# Patient Record
Sex: Male | Born: 1965 | Race: White | Hispanic: No | Marital: Single | Smoking: Current every day smoker
Health system: Southern US, Community
[De-identification: ages and names within clinical notes are randomized; demographics above are authoritative.]

## PROBLEM LIST (undated history)

## (undated) DIAGNOSIS — G4730 Sleep apnea, unspecified: Secondary | ICD-10-CM

## (undated) DIAGNOSIS — K219 Gastro-esophageal reflux disease without esophagitis: Secondary | ICD-10-CM

## (undated) DIAGNOSIS — M1990 Unspecified osteoarthritis, unspecified site: Secondary | ICD-10-CM

## (undated) DIAGNOSIS — K5900 Constipation, unspecified: Secondary | ICD-10-CM

## (undated) HISTORY — PX: OTHER SURGICAL HISTORY: SHX169

## (undated) HISTORY — PX: BACK SURGERY: SHX140

## (undated) HISTORY — PX: HAND SURGERY: SHX662

---

## 2010-09-24 ENCOUNTER — Emergency Department (HOSPITAL_BASED_OUTPATIENT_CLINIC_OR_DEPARTMENT_OTHER)
Admission: EM | Admit: 2010-09-24 | Discharge: 2010-09-24 | Disposition: A | Discharge status: Home / Self Care | Payer: Self-pay | Attending: Emergency Medicine | Admitting: Emergency Medicine

## 2010-09-24 DIAGNOSIS — G8929 Other chronic pain: Secondary | ICD-10-CM | POA: Insufficient documentation

## 2010-09-24 DIAGNOSIS — F17200 Nicotine dependence, unspecified, uncomplicated: Secondary | ICD-10-CM | POA: Insufficient documentation

## 2010-09-24 DIAGNOSIS — IMO0002 Reserved for concepts with insufficient information to code with codable children: Secondary | ICD-10-CM | POA: Insufficient documentation

## 2010-09-24 DIAGNOSIS — M5137 Other intervertebral disc degeneration, lumbosacral region: Secondary | ICD-10-CM | POA: Insufficient documentation

## 2010-09-24 DIAGNOSIS — M51379 Other intervertebral disc degeneration, lumbosacral region without mention of lumbar back pain or lower extremity pain: Secondary | ICD-10-CM | POA: Insufficient documentation

## 2011-01-06 ENCOUNTER — Encounter (HOSPITAL_COMMUNITY)
Admission: RE | Admit: 2011-01-06 | Discharge: 2011-01-06 | Disposition: A | Discharge status: Home / Self Care | Payer: Self-pay | Source: Ambulatory Visit | Attending: Neurological Surgery | Admitting: Neurological Surgery

## 2011-01-06 LAB — CBC
Hemoglobin: 15.4 g/dL (ref 13.0–17.0)
MCH: 32.4 pg (ref 26.0–34.0)
MCHC: 36.2 g/dL — ABNORMAL HIGH (ref 30.0–36.0)
MCV: 89.5 fL (ref 78.0–100.0)

## 2011-01-06 LAB — SURGICAL PCR SCREEN: MRSA, PCR: NEGATIVE

## 2011-01-12 ENCOUNTER — Ambulatory Visit (HOSPITAL_COMMUNITY)
Admission: RE | Admit: 2011-01-12 | Discharge: 2011-01-12 | Disposition: A | Discharge status: Home / Self Care | Payer: Self-pay | Source: Ambulatory Visit | Attending: Neurological Surgery | Admitting: Neurological Surgery

## 2011-01-12 ENCOUNTER — Inpatient Hospital Stay (HOSPITAL_COMMUNITY): Payer: Self-pay

## 2011-01-12 DIAGNOSIS — M5126 Other intervertebral disc displacement, lumbar region: Principal | ICD-10-CM | POA: Insufficient documentation

## 2011-01-12 DIAGNOSIS — Z01812 Encounter for preprocedural laboratory examination: Secondary | ICD-10-CM | POA: Insufficient documentation

## 2011-01-12 DIAGNOSIS — F17200 Nicotine dependence, unspecified, uncomplicated: Secondary | ICD-10-CM | POA: Insufficient documentation

## 2011-01-12 LAB — CBC
Hemoglobin: 15.4 g/dL (ref 13.0–17.0)
MCH: 32 pg (ref 26.0–34.0)
MCHC: 35.6 g/dL (ref 30.0–36.0)
MCV: 89.6 fL (ref 78.0–100.0)

## 2011-03-04 NOTE — Op Note (Signed)
Darrell Ramirez, Darrell Ramirez                ACCOUNT NO.:  [id number]  MEDICAL RECORD NO.:  [id number]  LOCATION:  3535                         FACILITY:  [facility]  PHYSICIAN:  Stefani Dama, M.D.  DATE OF BIRTH:  05-10-1965  DATE OF PROCEDURE:  01/12/2011 DATE OF DISCHARGE:  01/12/2011                              OPERATIVE REPORT   PREOPERATIVE DIAGNOSIS:  Herniated nucleus pulposus L5-S1 left, recurrent L4-L5.  POSTOPERATIVE DIAGNOSIS:  Herniated nucleus pulposus L5-S1 left, recurrent L4-L5.  OPERATION: 1. Repeat laminotomy and foraminotomy, decompressive diskectomy L5-S1     left with decompression of L5-S1 nerve roots. 2. Laminotomy and microdiskectomy L4-L5 left with operating microscope     microdissection technique and decompression of L4-L5 nerve root.  SURGEON:  Stefani Dama, MD  FIRST ASSISTANT:  Danae Orleans. Venetia Maxon, MD  ANESTHESIA:  General endotracheal.  INDICATIONS:  Darrell Ramirez is a 45 year old individual who has evidence of increasingly severe left lumbar radiculopathy in L5 and S1 distributions.  The patient has evidence of previous diskectomy done about 10 years ago at L5-S1 on the left.  An MRI done a number of months ago demonstrated that he had a recurrent herniated nucleus pulposus at L5-S1 on the left side.  He also had evidence of a subligamentous herniation of the disk at L4-L5 on the left causing lateral recess stenosis.  He has failed all conservative management.  The last month, he has had worsening pain.  He was advised regarding need for surgical decompression to the level that and is now taken to the operating room this day.  PROCEDURE:  The patient was brought to the operating room supine on the stretcher.  After smooth induction of general endotracheal anesthesia, he was turned prone.  Back was prepped with alcohol and DuraPrep and draped in a sterile fashion.  The previously made area of the scar was excised in an elliptical fashion.  Dissection  was taken down to the lumbodorsal fascia which was opened on the left side.  Interlaminar space at L5-S1 and L4-5 were identified positively with radiograph and laminotomies were then carried out.  At L5-S1, there was a significant amount of scar that has healed.  The resulting laminar defect was enlarged using a high-speed drill with a 0.5 mm dissector tool.  The yellow ligament was then carefully identified on to the inferior margin of lamina of L5 and then this being taken up the common dural tube could be traced out to the lateral aspects.  There was some significant adherent scar tissue on the lateral aspect of the common dural tube and the medial wall of the pedicle was carefully dissected free.  Nerve roots were then immobilized.  Underneath it, there was noted to be a substantial mass of recurrent disk material.  This was removed in a very piecemeal fashion as it was attached with the dura and the dissection was undertaken slowly.  Nonetheless, in the end, I was able to mobilize the common dural tube in the S1 nerve root fully off the disk space and entering the disk space which was significantly collapsed. The disk space was evacuated potentially with the amount of severely degenerated and desiccated disk material.  Portion of the procedure was done with Dr. __________ with lateral retraction on the common dural tube and the nerve roots were laterally decompressed interspace while I worked towards the medial aspect.  We then examined L4-L5 and here we enlarged laminotomy.  We removed the yellow ligament, identified the common dural tube and noted that the takeoff of the L5 nerve root was mobilized medially over a significant mass.  This was found to be a subligamentous herniated portion of this and when the ligament was probed, fragment of disk was elicited through an opening.  The opening was then enlarged in the same method where there was a significant amount of  moderately degenerated disk material within the disk space.  Disk space was evacuated above and this field was the same from its aperture.  The aperture was slightly enlarged to allow for complete diskectomy.  In the end, the common dural tube takeoff the L4 nerve root superiorly, L5 nerve root inferiorly, S1 nerve root below that were well processed. Hemostasis was achieved in the soft tissues and then after careful inspection and irrigation with copious amounts of antibiotic irrigation, the lumbodorsal fascia was closed with #1 Vicryl in interrupted fashion, 2-0 Vicryl in the subcutaneous tissues, 3-0 Vicryl subcuticularly, Dermabond was used on the skin prior to closure of the subcutaneous tissues.  The fascia was infiltrated with 20 mL of 0.5% Marcaine.  Blood loss for the procedure was estimated __________.     Stefani Dama, M.D.     Merla Riches  D:  01/12/2011  T:  01/13/2011  Job:  [id number]  Electronically Signed by Barnett Abu M.D. on 03/04/2011 06:52:15 AM

## 2011-04-21 ENCOUNTER — Other Ambulatory Visit: Payer: Self-pay | Admitting: Neurological Surgery

## 2011-04-21 DIAGNOSIS — M5126 Other intervertebral disc displacement, lumbar region: Secondary | ICD-10-CM

## 2011-04-21 DIAGNOSIS — M5136 Other intervertebral disc degeneration, lumbar region: Secondary | ICD-10-CM

## 2011-09-03 ENCOUNTER — Other Ambulatory Visit: Payer: Self-pay | Admitting: Neurological Surgery

## 2011-09-23 ENCOUNTER — Encounter (HOSPITAL_COMMUNITY): Payer: Self-pay

## 2011-09-23 ENCOUNTER — Inpatient Hospital Stay (HOSPITAL_COMMUNITY): Admission: RE | Admit: 2011-09-23 | Discharge: 2011-09-23 | Payer: Self-pay | Source: Ambulatory Visit

## 2011-09-23 HISTORY — DX: Gastro-esophageal reflux disease without esophagitis: K21.9

## 2011-09-23 HISTORY — DX: Sleep apnea, unspecified: G47.30

## 2011-09-23 NOTE — Pre-Procedure Instructions (Signed)
20 Darrell Ramirez  09/23/2011   Your procedure is scheduled on: 7:30 AM Report to [facility] at 5:30AM.  Call this number if you have problems the morning of surgery: [phone]   Remember:   Do not eat food:After Midnight.  May have clear liquids: up to 4 Hours before arrival. Until 1:30 AM  Clear liquids include soda, tea, black coffee, apple or grape juice, broth.  Take these medicines the morning of surgery with A SIP OF WATER  Do not wear jewelry, make-up or nail polish.  Do not wear lotions, powders, or perfumes. You may wear deodorant.  Do not shave 48 hours prior to surgery. Men may shave face and neck.  Do not bring valuables to the hospital.  Contacts, dentures or bridgework may not be worn into surgery.  Leave suitcase in the car. After surgery it may be brought to your room.  For patients admitted to the hospital, checkout time is 11:00 AM the day of discharge.   Patients discharged the day of surgery will not be allowed to drive home.  Name and phone number of your driver:  Special Instructions: CHG Shower Use Special Wash: 1/2 bottle night before surgery and 1/2 bottle morning of surgery.   Please read over the following fact sheets that you were given: Pain Booklet, Coughing and Deep Breathing, MRSA Information and Surgical Site Infection Prevention

## 2011-09-29 ENCOUNTER — Encounter (HOSPITAL_COMMUNITY): Payer: Self-pay | Admitting: Pharmacy Technician

## 2011-09-30 ENCOUNTER — Inpatient Hospital Stay (HOSPITAL_COMMUNITY): Admission: RE | Admit: 2011-09-30 | Payer: Self-pay | Source: Ambulatory Visit

## 2011-10-05 ENCOUNTER — Encounter (HOSPITAL_COMMUNITY)
Admission: RE | Admit: 2011-10-05 | Discharge: 2011-10-05 | Disposition: A | Discharge status: Home / Self Care | Payer: Medicaid Other | Source: Ambulatory Visit | Attending: Neurological Surgery | Admitting: Neurological Surgery

## 2011-10-05 LAB — CBC
MCH: 30.6 pg (ref 26.0–34.0)
MCHC: 34.6 g/dL (ref 30.0–36.0)
MCV: 88.4 fL (ref 78.0–100.0)
Platelets: 418 10*3/uL — ABNORMAL HIGH (ref 150–400)
RBC: 4.93 MIL/uL (ref 4.22–5.81)
RDW: 12.9 % (ref 11.5–15.5)

## 2011-10-05 LAB — BASIC METABOLIC PANEL
BUN: 11 mg/dL (ref 6–23)
CO2: 27 mEq/L (ref 19–32)
Calcium: 10.1 mg/dL (ref 8.4–10.5)
Creatinine, Ser: 0.89 mg/dL (ref 0.50–1.35)
GFR calc non Af Amer: >90 mL/min (ref >90)
Glucose, Bld: 108 mg/dL — ABNORMAL HIGH (ref 70–99)
Sodium: 139 mEq/L (ref 135–145)

## 2011-10-05 LAB — TYPE AND SCREEN: ABO/RH(D): A POS

## 2011-10-11 MED ORDER — CEFAZOLIN SODIUM-DEXTROSE 2-3 GM-% IV SOLR: 2.0000 g | INTRAVENOUS | Status: DC

## 2011-10-12 ENCOUNTER — Inpatient Hospital Stay (HOSPITAL_COMMUNITY)
Admission: RE | Admit: 2011-10-12 | Discharge: 2011-10-15 | DRG: 460 | Disposition: A | Discharge status: Home / Self Care | Payer: Medicaid Other | Source: Ambulatory Visit | Attending: Neurological Surgery | Admitting: Neurological Surgery

## 2011-10-12 ENCOUNTER — Ambulatory Visit (HOSPITAL_COMMUNITY): Payer: Medicaid Other

## 2011-10-12 ENCOUNTER — Encounter (HOSPITAL_COMMUNITY)
Admission: RE | Disposition: A | Discharge status: Home / Self Care | Payer: Self-pay | Source: Ambulatory Visit | Attending: Neurological Surgery

## 2011-10-12 ENCOUNTER — Encounter (HOSPITAL_COMMUNITY): Payer: Self-pay | Admitting: Anesthesiology

## 2011-10-12 ENCOUNTER — Encounter (HOSPITAL_COMMUNITY): Payer: Self-pay | Admitting: *Deleted

## 2011-10-12 ENCOUNTER — Ambulatory Visit (HOSPITAL_COMMUNITY): Payer: Medicaid Other | Admitting: Anesthesiology

## 2011-10-12 DIAGNOSIS — F17200 Nicotine dependence, unspecified, uncomplicated: Secondary | ICD-10-CM | POA: Diagnosis present

## 2011-10-12 DIAGNOSIS — Z885 Allergy status to narcotic agent status: Secondary | ICD-10-CM

## 2011-10-12 DIAGNOSIS — M48061 Spinal stenosis, lumbar region without neurogenic claudication: Secondary | ICD-10-CM

## 2011-10-12 DIAGNOSIS — M47817 Spondylosis without myelopathy or radiculopathy, lumbosacral region: Principal | ICD-10-CM | POA: Diagnosis present

## 2011-10-12 DIAGNOSIS — K219 Gastro-esophageal reflux disease without esophagitis: Secondary | ICD-10-CM | POA: Diagnosis present

## 2011-10-12 DIAGNOSIS — G8929 Other chronic pain: Secondary | ICD-10-CM | POA: Diagnosis present

## 2011-10-12 DIAGNOSIS — M5106 Intervertebral disc disorders with myelopathy, lumbar region: Secondary | ICD-10-CM | POA: Diagnosis present

## 2011-10-12 DIAGNOSIS — G4730 Sleep apnea, unspecified: Secondary | ICD-10-CM | POA: Diagnosis present

## 2011-10-12 SURGERY — POSTERIOR LUMBAR FUSION 2 LEVEL
Anesthesia: General | Site: Spine Lumbar | Wound class: Clean

## 2011-10-12 MED ORDER — BUPIVACAINE HCL (PF) 0.5 % IJ SOLN
INTRAMUSCULAR | Status: DC | PRN
  Administered 2011-10-12: 25 mL
  Administered 2011-10-12: 5 mL

## 2011-10-12 MED ORDER — ONDANSETRON HCL 4 MG/2ML IJ SOLN: 4.0000 mg | Freq: Four times a day (QID) | INTRAMUSCULAR | Status: DC | PRN

## 2011-10-12 MED ORDER — DIAZEPAM 5 MG PO TABS
ORAL_TABLET | ORAL | Status: AC
  Filled 2011-10-12: qty 1

## 2011-10-12 MED ORDER — SODIUM CHLORIDE 0.9 % IJ SOLN: 3.0000 mL | INTRAMUSCULAR | Status: DC | PRN

## 2011-10-12 MED ORDER — ONDANSETRON HCL 4 MG/2ML IJ SOLN
INTRAMUSCULAR | Status: DC | PRN
  Administered 2011-10-12: 4 mg via INTRAVENOUS

## 2011-10-12 MED ORDER — GLYCOPYRROLATE 0.2 MG/ML IJ SOLN
INTRAMUSCULAR | Status: DC | PRN
  Administered 2011-10-12: 0.2 mg via INTRAVENOUS

## 2011-10-12 MED ORDER — DROPERIDOL 2.5 MG/ML IJ SOLN: 0.6250 mg | INTRAMUSCULAR | Status: DC | PRN

## 2011-10-12 MED ORDER — DIAZEPAM 5 MG PO TABS
5.0000 mg | ORAL_TABLET | Freq: Four times a day (QID) | ORAL | Status: DC | PRN
  Administered 2011-10-12 – 2011-10-13 (×4): 5 mg via ORAL
  Filled 2011-10-12 (×4): qty 1

## 2011-10-12 MED ORDER — CEFAZOLIN SODIUM-DEXTROSE 2-3 GM-% IV SOLR
INTRAVENOUS | Status: AC
  Administered 2011-10-12: 1 g via INTRAVENOUS
  Administered 2011-10-12: 2 g via INTRAVENOUS
  Filled 2011-10-12: qty 50

## 2011-10-12 MED ORDER — SODIUM CHLORIDE 0.9 % IV SOLN
INTRAVENOUS | Status: AC
  Filled 2011-10-12: qty 500

## 2011-10-12 MED ORDER — VECURONIUM BROMIDE 10 MG IV SOLR
INTRAVENOUS | Status: DC | PRN
  Administered 2011-10-12 (×3): 2 mg via INTRAVENOUS
  Administered 2011-10-12 (×2): 1 mg via INTRAVENOUS
  Administered 2011-10-12: 2 mg via INTRAVENOUS
  Administered 2011-10-12 (×2): 1 mg via INTRAVENOUS
  Administered 2011-10-12: 2 mg via INTRAVENOUS

## 2011-10-12 MED ORDER — 0.9 % SODIUM CHLORIDE (POUR BTL) OPTIME
TOPICAL | Status: DC | PRN
  Administered 2011-10-12: 1000 mL

## 2011-10-12 MED ORDER — LIDOCAINE-EPINEPHRINE 1 %-1:100000 IJ SOLN
INTRAMUSCULAR | Status: DC | PRN
  Administered 2011-10-12: 5 mL

## 2011-10-12 MED ORDER — LACTATED RINGERS IV SOLN
INTRAVENOUS | Status: DC | PRN
  Administered 2011-10-12 (×2): via INTRAVENOUS

## 2011-10-12 MED ORDER — ACETAMINOPHEN 325 MG PO TABS: 650.0000 mg | ORAL_TABLET | ORAL | Status: DC | PRN

## 2011-10-12 MED ORDER — ACETAMINOPHEN 650 MG RE SUPP: 650.0000 mg | RECTAL | Status: DC | PRN

## 2011-10-12 MED ORDER — HYDROMORPHONE HCL PF 1 MG/ML IJ SOLN
0.2500 mg | INTRAMUSCULAR | Status: DC | PRN
  Administered 2011-10-12 (×4): 0.5 mg via INTRAVENOUS

## 2011-10-12 MED ORDER — HYDROMORPHONE HCL PF 1 MG/ML IJ SOLN
INTRAMUSCULAR | Status: AC
  Filled 2011-10-12: qty 1

## 2011-10-12 MED ORDER — CEFAZOLIN SODIUM 1-5 GM-% IV SOLN
1.0000 g | Freq: Three times a day (TID) | INTRAVENOUS | Status: AC
  Administered 2011-10-12 – 2011-10-13 (×2): 1 g via INTRAVENOUS
  Filled 2011-10-12 (×2): qty 50

## 2011-10-12 MED ORDER — SODIUM CHLORIDE 0.9 % IV SOLN
INTRAVENOUS | Status: DC
  Administered 2011-10-12 – 2011-10-13 (×2): via INTRAVENOUS

## 2011-10-12 MED ORDER — SODIUM CHLORIDE 0.9 % IV SOLN
INTRAVENOUS | Status: DC | PRN
  Administered 2011-10-12: 12:00:00 via INTRAVENOUS

## 2011-10-12 MED ORDER — KETOROLAC TROMETHAMINE 30 MG/ML IJ SOLN
INTRAMUSCULAR | Status: AC
  Administered 2011-10-12: 15 mg
  Filled 2011-10-12: qty 1

## 2011-10-12 MED ORDER — DIPHENHYDRAMINE HCL 50 MG/ML IJ SOLN: 12.5000 mg | Freq: Four times a day (QID) | INTRAMUSCULAR | Status: DC | PRN

## 2011-10-12 MED ORDER — MENTHOL 3 MG MT LOZG: 1.0000 | LOZENGE | OROMUCOSAL | Status: DC | PRN

## 2011-10-12 MED ORDER — ONDANSETRON HCL 4 MG/2ML IJ SOLN
4.0000 mg | INTRAMUSCULAR | Status: DC | PRN
  Administered 2011-10-12: 4 mg via INTRAVENOUS
  Filled 2011-10-12: qty 2

## 2011-10-12 MED ORDER — NALOXONE HCL 0.4 MG/ML IJ SOLN: 0.4000 mg | INTRAMUSCULAR | Status: DC | PRN

## 2011-10-12 MED ORDER — LIDOCAINE HCL (CARDIAC) 20 MG/ML IV SOLN
INTRAVENOUS | Status: DC | PRN
  Administered 2011-10-12: 80 mg via INTRAVENOUS

## 2011-10-12 MED ORDER — ROCURONIUM BROMIDE 100 MG/10ML IV SOLN
INTRAVENOUS | Status: DC | PRN
  Administered 2011-10-12: 10 mg via INTRAVENOUS
  Administered 2011-10-12: 20 mg via INTRAVENOUS
  Administered 2011-10-12: 70 mg via INTRAVENOUS

## 2011-10-12 MED ORDER — CEFAZOLIN SODIUM 1-5 GM-% IV SOLN
INTRAVENOUS | Status: AC
  Filled 2011-10-12: qty 50

## 2011-10-12 MED ORDER — PNEUMOCOCCAL VAC POLYVALENT 25 MCG/0.5ML IJ INJ
0.5000 mL | INJECTION | INTRAMUSCULAR | Status: AC
  Administered 2011-10-13: 0.5 mL via INTRAMUSCULAR
  Filled 2011-10-12: qty 0.5

## 2011-10-12 MED ORDER — THROMBIN 20000 UNITS EX KIT
PACK | CUTANEOUS | Status: DC | PRN
  Administered 2011-10-12: 09:00:00 via TOPICAL

## 2011-10-12 MED ORDER — KETOROLAC TROMETHAMINE 15 MG/ML IJ SOLN
15.0000 mg | Freq: Four times a day (QID) | INTRAMUSCULAR | Status: DC | PRN
  Administered 2011-10-12 – 2011-10-14 (×8): 15 mg via INTRAVENOUS
  Filled 2011-10-12 (×5): qty 1

## 2011-10-12 MED ORDER — BACITRACIN 50000 UNITS IM SOLR
INTRAMUSCULAR | Status: AC
  Filled 2011-10-12: qty 1

## 2011-10-12 MED ORDER — FENTANYL CITRATE 0.05 MG/ML IJ SOLN
INTRAMUSCULAR | Status: DC | PRN
  Administered 2011-10-12: 100 ug via INTRAVENOUS
  Administered 2011-10-12: 50 ug via INTRAVENOUS
  Administered 2011-10-12: 100 ug via INTRAVENOUS

## 2011-10-12 MED ORDER — SENNA 8.6 MG PO TABS
1.0000 | ORAL_TABLET | Freq: Two times a day (BID) | ORAL | Status: DC
  Administered 2011-10-13 – 2011-10-14 (×4): 8.6 mg via ORAL
  Filled 2011-10-12 (×7): qty 1

## 2011-10-12 MED ORDER — SUFENTANIL CITRATE 50 MCG/ML IV SOLN
INTRAVENOUS | Status: DC | PRN
  Administered 2011-10-12: 20 ug via INTRAVENOUS
  Administered 2011-10-12 (×3): 10 ug via INTRAVENOUS
  Administered 2011-10-12: 25 ug via INTRAVENOUS
  Administered 2011-10-12: 5 ug via INTRAVENOUS
  Administered 2011-10-12 (×2): 10 ug via INTRAVENOUS

## 2011-10-12 MED ORDER — PROPOFOL 10 MG/ML IV EMUL
INTRAVENOUS | Status: DC | PRN
  Administered 2011-10-12: 200 mg via INTRAVENOUS

## 2011-10-12 MED ORDER — SODIUM CHLORIDE 0.9 % IJ SOLN: 9.0000 mL | INTRAMUSCULAR | Status: DC | PRN

## 2011-10-12 MED ORDER — PHENOL 1.4 % MT LIQD: 1.0000 | OROMUCOSAL | Status: DC | PRN

## 2011-10-12 MED ORDER — PHENYLEPHRINE HCL 10 MG/ML IJ SOLN
INTRAMUSCULAR | Status: DC | PRN
  Administered 2011-10-12: 40 ug via INTRAVENOUS
  Administered 2011-10-12 (×2): 80 ug via INTRAVENOUS
  Administered 2011-10-12: 40 ug via INTRAVENOUS

## 2011-10-12 MED ORDER — MIDAZOLAM HCL 5 MG/5ML IJ SOLN
INTRAMUSCULAR | Status: DC | PRN
  Administered 2011-10-12: 2 mg via INTRAVENOUS

## 2011-10-12 MED ORDER — DIPHENHYDRAMINE HCL 12.5 MG/5ML PO ELIX: 12.5000 mg | ORAL_SOLUTION | Freq: Four times a day (QID) | ORAL | Status: DC | PRN

## 2011-10-12 MED ORDER — SODIUM CHLORIDE 0.9 % IV SOLN: 250.0000 mL | INTRAVENOUS | Status: DC

## 2011-10-12 MED ORDER — SODIUM CHLORIDE 0.9 % IR SOLN
Status: DC | PRN
  Administered 2011-10-12: 09:00:00

## 2011-10-12 MED ORDER — HYDROMORPHONE 0.3 MG/ML IV SOLN
INTRAVENOUS | Status: DC
  Administered 2011-10-12: 4.5 mg via INTRAVENOUS
  Administered 2011-10-12: 4.8 mg via INTRAVENOUS
  Administered 2011-10-12 (×2): via INTRAVENOUS
  Administered 2011-10-13: 5.38 mg via INTRAVENOUS
  Administered 2011-10-13 (×2): 3.3 mg via INTRAVENOUS
  Administered 2011-10-13: 06:00:00 via INTRAVENOUS
  Filled 2011-10-12 (×3): qty 25

## 2011-10-12 MED ORDER — SODIUM CHLORIDE 0.9 % IJ SOLN
3.0000 mL | Freq: Two times a day (BID) | INTRAMUSCULAR | Status: DC
  Administered 2011-10-12 – 2011-10-14 (×2): 3 mL via INTRAVENOUS

## 2011-10-12 MED ORDER — HYDROMORPHONE HCL PF 1 MG/ML IJ SOLN
INTRAMUSCULAR | Status: AC
  Administered 2011-10-12: 0.5 mg
  Filled 2011-10-12: qty 1

## 2011-10-12 SURGICAL SUPPLY — 77 items
ADH SKN CLS APL DERMABOND .7 (GAUZE/BANDAGES/DRESSINGS)
ADH SKN CLS LQ APL DERMABOND (GAUZE/BANDAGES/DRESSINGS) ×2
BAG DECANTER FOR FLEXI CONT (MISCELLANEOUS) ×2 IMPLANT
BLADE SURG 15 STRL LF DISP TIS (BLADE) IMPLANT
BLADE SURG 15 STRL SS (BLADE) ×2
BLADE SURG ROTATE 9660 (MISCELLANEOUS) IMPLANT
BUR MATCHSTICK NEURO 3.0 LAGG (BURR) ×2 IMPLANT
CAGE 11MM (Cage) ×2 IMPLANT
CANISTER SUCTION 2500CC (MISCELLANEOUS) ×2 IMPLANT
CLOTH BEACON ORANGE TIMEOUT ST (SAFETY) ×2 IMPLANT
CONT SPEC 4OZ CLIKSEAL STRL BL (MISCELLANEOUS) ×4 IMPLANT
COVER BACK TABLE 24X17X13 BIG (DRAPES) IMPLANT
COVER TABLE BACK 60X90 (DRAPES) ×2 IMPLANT
DECANTER SPIKE VIAL GLASS SM (MISCELLANEOUS) ×2 IMPLANT
DERMABOND ADHESIVE PROPEN (GAUZE/BANDAGES/DRESSINGS) ×2
DERMABOND ADVANCED (GAUZE/BANDAGES/DRESSINGS)
DERMABOND ADVANCED .7 DNX12 (GAUZE/BANDAGES/DRESSINGS) ×1 IMPLANT
DERMABOND ADVANCED .7 DNX6 (GAUZE/BANDAGES/DRESSINGS) IMPLANT
DRAPE C-ARM 42X72 X-RAY (DRAPES) ×4 IMPLANT
DRAPE LAPAROTOMY 100X72X124 (DRAPES) ×2 IMPLANT
DRAPE POUCH INSTRU U-SHP 10X18 (DRAPES) ×2 IMPLANT
DRAPE PROXIMA HALF (DRAPES) IMPLANT
DURAPREP 26ML APPLICATOR (WOUND CARE) ×2 IMPLANT
ELECT REM PT RETURN 9FT ADLT (ELECTROSURGICAL) ×2
ELECTRODE REM PT RTRN 9FT ADLT (ELECTROSURGICAL) ×1 IMPLANT
GAUZE SPONGE 4X4 16PLY XRAY LF (GAUZE/BANDAGES/DRESSINGS) ×1 IMPLANT
GLOVE BIO SURGEON STRL SZ8 (GLOVE) ×1 IMPLANT
GLOVE BIOGEL PI IND STRL 7.0 (GLOVE) IMPLANT
GLOVE BIOGEL PI IND STRL 8.5 (GLOVE) ×2 IMPLANT
GLOVE BIOGEL PI INDICATOR 7.0 (GLOVE) ×6
GLOVE BIOGEL PI INDICATOR 8.5 (GLOVE) ×2
GLOVE ECLIPSE 8.5 STRL (GLOVE) ×5 IMPLANT
GLOVE EXAM NITRILE LRG STRL (GLOVE) IMPLANT
GLOVE EXAM NITRILE MD LF STRL (GLOVE) IMPLANT
GLOVE EXAM NITRILE XL STR (GLOVE) IMPLANT
GLOVE EXAM NITRILE XS STR PU (GLOVE) IMPLANT
GLOVE SS BIOGEL STRL SZ 6.5 (GLOVE) IMPLANT
GLOVE SUPERSENSE BIOGEL SZ 6.5 (GLOVE) ×2
GLOVE SURG SS PI 6.5 STRL IVOR (GLOVE) ×4 IMPLANT
GOWN BRE IMP SLV AUR LG STRL (GOWN DISPOSABLE) ×5 IMPLANT
GOWN BRE IMP SLV AUR XL STRL (GOWN DISPOSABLE) ×1 IMPLANT
GOWN STRL REIN 2XL LVL4 (GOWN DISPOSABLE) ×4 IMPLANT
KIT BASIN OR (CUSTOM PROCEDURE TRAY) ×2 IMPLANT
KIT ROOM TURNOVER OR (KITS) ×2 IMPLANT
MILL MEDIUM DISP (BLADE) ×1 IMPLANT
NDL SPNL 18GX3.5 QUINCKE PK (NEEDLE) IMPLANT
NEEDLE HYPO 22GX1.5 SAFETY (NEEDLE) ×2 IMPLANT
NEEDLE SPNL 18GX3.5 QUINCKE PK (NEEDLE) ×2 IMPLANT
NS IRRIG 1000ML POUR BTL (IV SOLUTION) ×2 IMPLANT
PACK FOAM VITOSS 10CC (Orthopedic Implant) ×1 IMPLANT
PACK LAMINECTOMY NEURO (CUSTOM PROCEDURE TRAY) ×2 IMPLANT
PAD ARMBOARD 7.5X6 YLW CONV (MISCELLANEOUS) ×8 IMPLANT
PATTIES SURGICAL .5 X1 (DISPOSABLE) ×1 IMPLANT
PATTIES SURGICAL 1X1 (DISPOSABLE) ×1 IMPLANT
PEEK PLIF NOVEL 9X25X12 (Peek) ×2 IMPLANT
ROD TI 5.5MM 5CM (Rod) ×2 IMPLANT
SCREW 35MM PEDICLE (Screw) ×2 IMPLANT
SCREW PEDICLE 45MM (Screw) ×1 IMPLANT
SCREW POLYAX 6.5X45MM (Screw) ×2 IMPLANT
SCREW SET SPINAL STD HEXALOBE (Screw) ×6 IMPLANT
SCREW SPINAL 6.5MMX50MM (Screw) ×1 IMPLANT
SPONGE GAUZE 4X4 12PLY (GAUZE/BANDAGES/DRESSINGS) ×2 IMPLANT
SPONGE LAP 4X18 X RAY DECT (DISPOSABLE) IMPLANT
SPONGE SURGIFOAM ABS GEL 100 (HEMOSTASIS) ×2 IMPLANT
SUT VIC AB 1 CT1 18XBRD ANBCTR (SUTURE) ×1 IMPLANT
SUT VIC AB 1 CT1 8-18 (SUTURE) ×4
SUT VIC AB 2-0 CP2 18 (SUTURE) ×3 IMPLANT
SUT VIC AB 3-0 SH 8-18 (SUTURE) ×3 IMPLANT
SYR 20ML ECCENTRIC (SYRINGE) ×2 IMPLANT
SYR 3ML LL SCALE MARK (SYRINGE) ×5 IMPLANT
SYR 5ML LL (SYRINGE) IMPLANT
TAPE CLOTH SURG 4X10 WHT LF (GAUZE/BANDAGES/DRESSINGS) ×1 IMPLANT
TOWEL OR 17X24 6PK STRL BLUE (TOWEL DISPOSABLE) ×2 IMPLANT
TOWEL OR 17X26 10 PK STRL BLUE (TOWEL DISPOSABLE) ×2 IMPLANT
TRAP SPECIMEN MUCOUS 40CC (MISCELLANEOUS) ×2 IMPLANT
TRAY FOLEY CATH 14FRSI W/METER (CATHETERS) ×2 IMPLANT
WATER STERILE IRR 1000ML POUR (IV SOLUTION) ×2 IMPLANT

## 2011-10-12 NOTE — H&P (Signed)
Darrell Ramirez is an 46 y.o. male.   Chief Complaint: Back and left leg pain HPI: Darrell Ramirez continues to have back and left leg pain. We visited  at the end of December and I subsequently had him visit with Dr. Ollen Bowl regarding pain management.  He has been having continued problems with pain.  He had been on OxyContin 10 mg. and was switched to Oxycodone 10 mg. t.i.d. as needed for pain.  He has been out of that medication.  He has been using some Indomethacin 50 mg. q.d. but he stopped this also.  He has been having a fair amount of chronic pain with both back pain and radicular pain.  Darrell Ramirez feels that something ultimately needs to be done for his back as he cannot tolerate the back pain.  I am reluctant to maintain him on narcotic analgesics, however.  I previously discussed with him consideration of a 2-level decompression arthrodesis.  This would involve removing the entirety of the disc at L4-5 and L5-S1.  He has had surgery there and his last MRI shows that he has some epidural fibrosis particularly on the left side at L4-5 and again on the left at L5-S1 with some lateral recess stenosis particularly at L5-S1.  The result of a surgical fusion of L4 to the sacrum remains quite variable.  I discussed this with Darrell Ramirez today.  He knows that there are some risks of the surgery not limited to potential further nerve injury, possibility of scar tissue formation, possibility of a spinal fluid leak.  All of these things not withstanding, Darrell Ramirez would like to proceed with the surgical intervention.    I indicated that the typical hospital stay would be approximately 3-5 days depending on the individual patient.  Afterwards, he would have to wear an external corset for a period of about 6-8 weeks and thereafter he would require some rehabilitation for about 3 months.  It generally takes people about six months or more to recover from surgical intervention.  Darrell Ramirez is convinced that he will need to proceed with surgical  intervention.  I noted to Darrell Ramirez that I would want to caution him about using any narcotic analgesics as he is being weaned off now for nearly a month's time.  I can see him at bimonthly intervals, that is every other month, to see if he needs any changes in his care.  He tells me that he has applied for Medicaid and is hopeful that this may come through for him so he can get on with his surgical intervention.    Past Medical History  Diagnosis Date  . Sleep apnea     has not been tested  . GERD (gastroesophageal reflux disease) otc mrdication    Past Surgical History  Procedure Date  . Arthroscopic knee     times 2 right and left  . Back surgery times 2    No family history on file. Social History:  reports that he has been smoking Cigarettes.  He has a 5 pack-year smoking history. He does not have any smokeless tobacco history on file. He reports that he drinks alcohol. He reports that he does not use illicit drugs.  Allergies:  Allergies  Allergen Reactions  . Vicodin (Hydrocodone-Acetaminophen) Itching  . Tylenol Pm Extra (Diphenhydramine-Apap (Sleep))     Insomnia     Medications Prior to Admission  Medication Sig Dispense Refill  . ibuprofen (ADVIL,MOTRIN) 200 MG tablet Take 1,000 mg by mouth 2 (two) times daily as  needed. For pain      . indomethacin (INDOCIN) 50 MG capsule Take 50 mg by mouth 2 (two) times daily with a meal.      . KRILL OIL PO Take 1 capsule by mouth daily.      Marland Kitchen oxyCODONE (OXY IR/ROXICODONE) 5 MG immediate release tablet Take 10 mg by mouth 3 (three) times daily as needed. For severe pain        No results found for this or any previous visit (from the past 48 hour(s)). No results found.  Review of Systems  HENT: Negative.   Eyes: Negative.   Respiratory: Negative.   Cardiovascular: Negative.   Gastrointestinal: Positive for heartburn.  Musculoskeletal: Positive for myalgias and back pain.  Skin: Negative.   Neurological: Positive for tingling,  sensory change, focal weakness and weakness.  Endo/Heme/Allergies: Negative.   Psychiatric/Behavioral: The patient is nervous/anxious.     Blood pressure 107/72, pulse 72, temperature 98 F (36.7 C), temperature source Oral, resp. rate 18, SpO2 97.00%. Physical Exam  Constitutional: He is oriented to person, place, and time. He appears well-developed and well-nourished.  HENT:  Head: Normocephalic and atraumatic.  Eyes: Conjunctivae and EOM are normal. Pupils are equal, round, and reactive to light.  Neck: Normal range of motion. Neck supple.  Cardiovascular: Normal rate, regular rhythm and normal heart sounds.   Respiratory: Effort normal and breath sounds normal.  GI: Soft. Bowel sounds are normal.  Musculoskeletal: He exhibits tenderness.       Left leg atrophy and weakness in tibialis anterior group.Positive SLR on Left at 30 degrees.  Neurological: He is alert and oriented to person, place, and time. He displays abnormal reflex. No cranial nerve deficit. Coordination normal.       No patellar or achilles reflex.  Skin: Skin is warm and dry.  Psychiatric: He has a normal mood and affect. His behavior is normal. Judgment and thought content normal.     Assessment/Plan Decompression and fusion L4-5 and L5-S1  Darrell Ramirez J 10/12/2011, 6:04 AM

## 2011-10-12 NOTE — Transfer of Care (Signed)
Immediate Anesthesia Transfer of Care Note  Patient: Darrell Ramirez  Procedure(s) Performed: Procedure(s) (LRB): POSTERIOR LUMBAR FUSION 2 LEVEL (N/A)  Patient Location: PACU  Anesthesia Type: General  Level of Consciousness: awake, alert , oriented and sedated  Airway & Oxygen Therapy: Patient Spontanous Breathing and Patient connected to nasal cannula oxygen  Post-op Assessment: Report given to PACU RN, Post -op Vital signs reviewed and stable and Patient moving all extremities  Post vital signs: Reviewed and stable  Complications: No apparent anesthesia complications

## 2011-10-12 NOTE — Preoperative (Signed)
Beta Blockers   Reason not to administer Beta Blockers:Not Applicable 

## 2011-10-12 NOTE — Anesthesia Procedure Notes (Signed)
Procedure Name: Intubation Performed by: Fransisca Kaufmann Pre-anesthesia Checklist: Patient identified, Emergency Drugs available, Suction available, Patient being monitored and Timeout performed Patient Re-evaluated:Patient Re-evaluated prior to inductionOxygen Delivery Method: Circle system utilized and Simple face mask Preoxygenation: Pre-oxygenation with 100% oxygen Intubation Type: IV induction Ventilation: Mask ventilation without difficulty Laryngoscope Size: Mac and 3 Grade View: Grade II Tube type: Oral Tube size: 8.0 mm Number of attempts: 1 Airway Equipment and Method: Stylet and Bite block Placement Confirmation: ETT inserted through vocal cords under direct vision,  positive ETCO2,  CO2 detector and breath sounds checked- equal and bilateral Secured at: 23 cm Tube secured with: Tape Dental Injury: Teeth and Oropharynx as per pre-operative assessment

## 2011-10-12 NOTE — Op Note (Addendum)
Preoperative diagnosis: Lumbar spondylosis with radiculopathy L4-5 and L5-S1, recurrent herniated nucleus pulposus left lumbar radiculopathy stenosis of left L4-L5 and S1 nerve roots Postoperative diagnosis: Lumbar spondylosis with radiculopathy L4-5 and L5-S1, recurrent herniated nucleus pulposus with left lumbar radiculopathy. Stenosis of the left L4-L5 and S1 nerve roots. Procedure: Lumbar decompression via laminectomy of L5 and L4 with work greater than require for simple posterior interbody arthrodesis, decompression of L4-L5 and S1 nerve roots on the left side, posterior lumbar interbody arthrodesis using peek spacers autograft and allograft at L4-5 and L5-S1, pedicle screw fixation L4-L5 and the sacrum, posterior lateral arthrodesis with autograft and allograft L4 to sacrum  Surgeon: Barnett Abu M.D. Assistant: Marikay Alar M.D. Anesthesia: Gen. endotracheal Indications: Patient is a 46 year old man whose had 2 previous decompressions and discectomies at L4-5 L5-S1 on the left most recent one was in September 2012 has had persistent left lumbar radiculopathy in both an L5 and S1 distribution she has evidence of advanced spondylosis at each of these levels with evidence of a recurrent disc herniation at L4-5 he's been advised regarding the need for surgical decompression with undercutting of the facet joints so as to render the spine unstable and also now he's been advised to undergo stabilization and fixation from L4 to the sacrum  Procedure: The patient was brought to the operating room. After the smooth induction of general endotracheal anesthesia the patient was carefully turned to the prone position padding the bony prominences appropriately. The back was prepped with alcohol and DuraPrep and draped in a sterile fashion. A midline incision was created and carried down to the lumbar dorsal fascia. Fascia was opened on either side of the midline to expose the spinous processes of the lower lumbar  spine. These were identified as L5 and L4 on the radiograph. The dissection was then completed to expose L4-5 and L5-S1.  The lateral gutters were then uncovered and bone in this area was removed to decorticate the cortical surfaces from L4 to the sacrum this area was then packed off. Laminectomy was then undertaken removing the entire laminar arch of L5 removing the inferior portion of the lamina including the entirety of the facet joint to the pars region of L4. The yellow ligament was taken up and the underlying dura was identified the dissection was carried down to the lateral gutters to decompress the L4 nerve root superiorly and then the L5 nerve root was decompressed inferiorly at the level of L4-5 entirety of L5 could not get identified and decompressed until lamina of L5 was removed and this was then undertaken taking carefully as her substantial scar tissue in the left lateral recess there is evidence of recurrent disc herniation in this area also was carefully dissected away from the dura so as to maintain the integrity of the dura and during this procedure no spinal fluid leaks had occurred. There was stenosis of the left L4 left L5 and left S1 nerve roots that were individually decompressed. The right-sided nerve roots were similarly decompressed the decompression was much easier as there had not been previous surgery here and there is no evidence of significant scar tissue.  After the nerve roots were decompressed and the dura was isolated the interbody spaces were entered and a complete discectomy was performed at L4-5 bilaterally and laterally and also at L5-S1. At L4-5 the area was prepared by decorticate Ting the endplates and sizing the interspace to fit a 12 mm tall peek spacer. Peek spacers were then filled with autograft  and allograft. The allograft was the cost. The autograft was bone shavings and also the bone of the lamina and facet joints of L4-L5 and the sacrum. The L4-5 space was then  fixed and filled with the peek spacers and also the autograft bone. Attention was then turned to L5-S1 here 11 mm peek spacers could be fitted into the interspace after distracting and clean the interspace and decorticating it fully.  At this point pedicle entry sites were then chosen at L4-L5 and the sacrum at L4 medial to lateral trajectory pedicle screws were chosen and these worse 5.5 x 35 mm pedicle screws. At L5 6.5 x 45 mm pedicle screws were used and these were first sounded then tapped and then placed after checking the holes for for any evidence of cutout. No evidence of cutout was found. The S1 screws were then performed in a similar fashion 6.5 x 50 mm screw was used on the left side to explain 5 x 45 mm screw was used on the right. After this was accomplished the rods were contoured to fit between the pedicle screws and this was a 15 mm precontoured rod which had some additional contouring added to it to fit between the screw heads from L4 to the sacrum. The lateral gutters were then packed with the autograft and allograft bone combination that was remaining. Once the rod construct was completed the area was inspected carefully and the pads of the L4 the L5 and S1 nerve roots were checked to make sure that there are patent and adequately decompressed when this was verified then the lumbar dorsal fascia was closed with #1 Vicryl in interrupted fashion 2-0 Vicryl was used in the subcutaneous tissues and 3-0 Vicryl subcuticularly blood loss for the procedure was estimated at some foreign cc 100 cc of Cell Saver blood was returned to the patient. The patient was returned to the recovery room in stable condition.

## 2011-10-12 NOTE — Progress Notes (Signed)
Patient ID: Darrell Ramirez, male   DOB: 08-18-1965, 46 y.o.   MRN: [id number] Alert oriented. Legs feel better. Back has significant muscle spasm. PCA pain medicine seems to be working well.  Incision is clean and dry. Motor function in lower extremities intact.   Plan mobilize as tolerated

## 2011-10-12 NOTE — Anesthesia Preprocedure Evaluation (Signed)
Anesthesia Evaluation  Patient identified by MRN, date of birth, ID band Patient awake    Reviewed: Allergy & Precautions, H&P , NPO status , Patient's Chart, lab work & pertinent test results, reviewed documented beta blocker date and time   Airway Mallampati: II TM Distance: >3 FB Neck ROM: Full    Dental  (+) Teeth Intact and Dental Advisory Given   Pulmonary sleep apnea ,  breath sounds clear to auscultation  Pulmonary exam normal       Cardiovascular negative cardio ROS  Rhythm:Regular Rate:Normal     Neuro/Psych negative psych ROS   GI/Hepatic Neg liver ROS, GERD-  Controlled,  Endo/Other  negative endocrine ROS  Renal/GU negative Renal ROS     Musculoskeletal   Abdominal   Peds  Hematology   Anesthesia Other Findings   Reproductive/Obstetrics                           Anesthesia Physical Anesthesia Plan  ASA: II  Anesthesia Plan: General   Post-op Pain Management:    Induction:   Airway Management Planned: Oral ETT  Additional Equipment:   Intra-op Plan:   Post-operative Plan: Extubation in OR  Informed Consent: I have reviewed the patients History and Physical, chart, labs and discussed the procedure including the risks, benefits and alternatives for the proposed anesthesia with the patient or authorized representative who has indicated his/her understanding and acceptance.     Plan Discussed with: CRNA, Anesthesiologist and Surgeon  Anesthesia Plan Comments:         Anesthesia Quick Evaluation

## 2011-10-12 NOTE — Anesthesia Postprocedure Evaluation (Signed)
Anesthesia Post Note  Patient: Darrell Ramirez  Procedure(s) Performed: Procedure(s) (LRB): POSTERIOR LUMBAR FUSION 2 LEVEL (N/A)  Anesthesia type: General  Patient location: PACU  Post pain: Pain level controlled and Adequate analgesia  Post assessment: Post-op Vital signs reviewed, Patient's Cardiovascular Status Stable, Respiratory Function Stable, Patent Airway and Pain level controlled  Last Vitals:  Filed Vitals:   10/12/11 1403  BP:   Pulse: 98  Temp:   Resp: 21    Post vital signs: Reviewed and stable  Level of consciousness: awake, alert  and oriented  Complications: No apparent anesthesia complications

## 2011-10-12 NOTE — Progress Notes (Signed)
Orthopedic Tech Progress Note Patient Details:  CHENG DEC 1965/09/28 [id number]  Patient ID: Darrell Ramirez, male   DOB: 11/20/1965, 46 y.o.   MRN: [id number]   Darrell Ramirez 10/12/2011, 3:16 PM Applied by bio tech LS support

## 2011-10-12 NOTE — Progress Notes (Signed)
UR COMPLETED  

## 2011-10-13 MED ORDER — HYDROMORPHONE HCL 2 MG PO TABS
2.0000 mg | ORAL_TABLET | ORAL | Status: DC | PRN
  Administered 2011-10-13 – 2011-10-14 (×6): 4 mg via ORAL
  Filled 2011-10-13: qty 1
  Filled 2011-10-13 (×3): qty 2
  Filled 2011-10-13: qty 1
  Filled 2011-10-13 (×2): qty 2

## 2011-10-13 MED ORDER — OXYCODONE-ACETAMINOPHEN 5-325 MG PO TABS
1.0000 | ORAL_TABLET | ORAL | Status: DC | PRN
  Filled 2011-10-13: qty 2

## 2011-10-13 MED ORDER — OXYCODONE HCL 5 MG PO TABS
5.0000 mg | ORAL_TABLET | ORAL | Status: DC | PRN
  Administered 2011-10-13: 10 mg via ORAL
  Filled 2011-10-13: qty 2

## 2011-10-13 MED ORDER — DIAZEPAM 5 MG PO TABS
5.0000 mg | ORAL_TABLET | ORAL | Status: DC | PRN
  Administered 2011-10-13 – 2011-10-15 (×11): 5 mg via ORAL
  Filled 2011-10-13 (×11): qty 1

## 2011-10-13 NOTE — Care Management Note (Signed)
    Page 1 of 2   10/15/2011     2:26:45 PM   CARE MANAGEMENT NOTE 10/15/2011  Patient:  Darrell Ramirez, Darrell Ramirez   Account Number:  [id number]  Date Initiated:  10/13/2011  Documentation initiated by:  Onnie Boer  Subjective/Objective Assessment:   PT WAS ADMITTED FOR SURGERY     Action/Plan:   PROGRESSION OF CARE AND DISCHARGE PLANNING   Anticipated DC Date:  10/16/2011   Anticipated DC Plan:  HOME W HOME HEALTH SERVICES      DC Planning Services  CM consult      Choice offered to / List presented to:  C-1 Patient   DME arranged  3-N-1  Levan Hurst      DME agency  [facility]        Status of service:  Completed, signed off Medicare Important Message given?   (If response is "NO", the following Medicare IM given date fields will be blank) Date Medicare IM given:   Date Additional Medicare IM given:    Discharge Disposition:  HOME/SELF CARE  Per UR Regulation:  Reviewed for med. necessity/level of care/duration of stay  If discussed at Long Length of Stay Meetings, dates discussed:    Comments:  10/15/11 Onnie Boer, RN, BSN 1425 PT WAS DC'D TO HOME WITH SELF CARE.  PT RECEIVED A RW AND 3N1 FROM [facility].   10/13/11 Onnie Boer, RN, BSN 1519 PT WILL NEED OP PT AND A RW.  PT'S WIFE WILL SEE IF SHE CAN GET A RW FROM SOME FRIENDS.  PT WANTS NCM TO RESEARCH THE COST OF A RW FROM [facility], IF MEDICAID DOESNT COVER IT.  WILL F/U ON DC NEEDS.  PT THINKS HE WILL BENEFIT FROM A TUB BENCH.

## 2011-10-13 NOTE — Progress Notes (Signed)
Patient ID: Darrell Ramirez, male   DOB: [birth year], 46 y.o.   MRN: [id number] Alert oriented motor function good in lower extremities incision is clean and dry. Patient complains mostly of muscle spasms the legs feel better. He has been ambulating. PCA pump with assorted monitoring devices is cumbersome. We'll DC PCA hep well IV and start patient on oral pain medications.

## 2011-10-13 NOTE — Clinical Social Work Note (Signed)
CSW received consult for SNF. Discussed pt during progression with RN. PT is recommending Outpatient PT. RNCM is aware. CSW is signing off as no further needs identified. Please reconsult if a need arises prior to discharge.   Dede Query, MSW, Theresia Majors [phone]

## 2011-10-13 NOTE — Progress Notes (Signed)
Patient has been complaining of severe pain since the PCA pump was D/C. Dr. Danielle Dess was notified and Percocet was switched to Oxy IR 5-10mg  Q3H PRN. One dose given. Also, Valium and Ketorlac given as much as possible. Patient was complaining that this regimen was still not working. Dr. Danielle Dess was paged again and changed the Oxy IR to PO Dilaudid 2-4mg  Q3H PRN. After all PRNs for pain had been given, patient continues to complain that he is having 10/10 pain. He is threatening to leave the hospital since he thinks we are no longer doing anything to help him. Charge nurse notified to speak with patient about this issue. Dr. Jeral Fruit (on call for [facility]) was paged. No new orders given. Dr. Jeral Fruit said he didn't feel safe giving any more narcotics to this regimen. Will continue to monitor.

## 2011-10-13 NOTE — Evaluation (Signed)
Physical Therapy Evaluation Patient Details Name: Darrell Ramirez MRN: [id number] DOB: May 30, 1965 Today's Date: 10/13/2011 Time: 3086-5784 PT Time Calculation (min): 35 min  PT Assessment / Plan / Recommendation Clinical Impression  pt presents with L4-S1 Lami and Decompression.  pt moving well and very motivated.  pt would benefit from OPPT after D/C.      PT Assessment  Patient needs continued PT services    Follow Up Recommendations  Outpatient PT;Supervision - Intermittent    Barriers to Discharge None      lEquipment Recommendations  Rolling walker with 5" wheels (Wife to see if she can borrow one.  )    Recommendations for Other Services     Frequency Min 5X/week    Precautions / Restrictions Precautions Precautions: Back Precaution Booklet Issued: Yes (comment) Required Braces or Orthoses: Spinal Brace Spinal Brace: Lumbar corset;Applied in sitting position Restrictions Weight Bearing Restrictions: No   Pertinent Vitals/Pain 8/10 in low back.  Using PCA.        Mobility  Bed Mobility Bed Mobility: Rolling Right;Right Sidelying to Sit;Sitting - Scoot to Edge of Bed Rolling Right: 5: Supervision;With rail Right Sidelying to Sit: 5: Supervision;With rails Sitting - Scoot to Edge of Bed: 5: Supervision Details for Bed Mobility Assistance: Mod verbal cues for log rolling technique Transfers Transfers: Sit to Stand;Stand to Sit Sit to Stand: 4: Min guard;With upper extremity assist;From bed Stand to Sit: 4: Min guard;With upper extremity assist;To chair/3-in-1 Details for Transfer Assistance: Min verbal cues for hand placement and technique Ambulation/Gait Ambulation/Gait Assistance: 4: Min guard Ambulation Distance (Feet): 200 Feet Assistive device: Rolling walker Ambulation/Gait Assistance Details: cues for safe use of RW, upright posture, back precautions Gait Pattern: Step-through pattern;Decreased stride length;Trunk flexed Stairs: No Wheelchair  Mobility Wheelchair Mobility: No    Exercises     PT Diagnosis: Difficulty walking;Acute pain  PT Problem List: Decreased activity tolerance;Decreased balance;Decreased mobility;Decreased knowledge of use of DME;Decreased knowledge of precautions;Pain PT Treatment Interventions: DME instruction;Gait training;Stair training;Functional mobility training;Therapeutic activities;Therapeutic exercise;Balance training;Neuromuscular re-education;Patient/family education   PT Goals Acute Rehab PT Goals PT Goal Formulation: With patient Time For Goal Achievement: 10/20/11 Potential to Achieve Goals: Good Pt will Roll Supine to Right Side: Independently PT Goal: Rolling Supine to Right Side - Progress: Goal set today Pt will Roll Supine to Left Side: Independently PT Goal: Rolling Supine to Left Side - Progress: Goal set today Pt will go Supine/Side to Sit: Independently PT Goal: Supine/Side to Sit - Progress: Goal set today Pt will go Sit to Supine/Side: Independently PT Goal: Sit to Supine/Side - Progress: Goal set today Pt will go Sit to Stand: with modified independence PT Goal: Sit to Stand - Progress: Goal set today Pt will go Stand to Sit: with modified independence PT Goal: Stand to Sit - Progress: Goal set today Pt will Ambulate: >150 feet;with modified independence;with rolling walker PT Goal: Ambulate - Progress: Goal set today Pt will Go Up / Down Stairs: 1-2 stairs;with supervision;with least restrictive assistive device PT Goal: Up/Down Stairs - Progress: Goal set today Additional Goals Additional Goal #1: pt will verbalize and follow back precautions.   PT Goal: Additional Goal #1 - Progress: Goal set today  Visit Information  Last PT Received On: 10/13/11 Assistance Needed: +1 PT/OT Co-Evaluation/Treatment: Yes    Subjective Data  Subjective: This is my third surgery.   Patient Stated Goal: Move without pain.     Prior Functioning  Home Living Lives With:  Friend(s) Available Help at  Discharge: Available PRN/intermittently Type of Home: House Home Access: Stairs to enter Entrance Stairs-Number of Steps: 1 Entrance Stairs-Rails: None Home Layout: One level Bathroom Shower/Tub: Forensic scientist: Standard Bathroom Accessibility: Yes How Accessible: Accessible via walker Home Adaptive Equipment: Straight cane Prior Function Level of Independence: Needs assistance Needs Assistance: Dressing Dressing:  (sometimes needed help with socks/shoes) Able to Take Stairs?: Yes Driving: Yes Vocation: On disability Communication Communication: No difficulties    Cognition  Overall Cognitive Status: Appears within functional limits for tasks assessed/performed Arousal/Alertness: Awake/alert Orientation Level: Appears intact for tasks assessed Behavior During Session: [facility] for tasks performed    Extremity/Trunk Assessment Right Upper Extremity Assessment RUE ROM/Strength/Tone: Within functional levels RUE Sensation: WFL - Light Touch RUE Coordination: WFL - gross/fine motor Left Upper Extremity Assessment LUE ROM/Strength/Tone: Within functional levels LUE Sensation: WFL - Light Touch LUE Coordination: WFL - gross/fine motor Right Lower Extremity Assessment RLE ROM/Strength/Tone: WFL for tasks assessed RLE Sensation: WFL - Light Touch Left Lower Extremity Assessment LLE ROM/Strength/Tone: WFL for tasks assessed LLE Sensation: WFL - Light Touch Trunk Assessment Trunk Assessment: Normal   Balance Balance Balance Assessed: No  End of Session PT - End of Session Equipment Utilized During Treatment: Gait belt;Back brace Activity Tolerance: Patient tolerated treatment well Patient left: in chair;with call bell/phone within reach;with family/visitor present Nurse Communication: Mobility status   Sunny Schlein, [place] [phone] 10/13/2011, 9:58 AM

## 2011-10-13 NOTE — Evaluation (Addendum)
Occupational Therapy Evaluation Patient Details Name: Darrell Ramirez MRN: [id number] DOB: 1966/03/14 Today's Date: 10/13/2011 Time: 4098-1191 OT Time Calculation (min): 37 min  OT Assessment / Plan / Recommendation Clinical Impression  Pt. presents s/p L4-S1 Lami and Decompression and with increased pain. Pt. will benefit from skilled OT to increase functional independence to mod I level at D/C home.    OT Assessment  Patient needs continued OT Services    Follow Up Recommendations  No OT follow up;Supervision - Intermittent    Barriers to Discharge None    Equipment Recommendations  Rolling walker with 5" wheels (Wife to see if she can borrow one.  )       Frequency  Min 2X/week    Precautions / Restrictions Precautions Precautions: Back Precaution Booklet Issued: Yes (comment) Required Braces or Orthoses: Spinal Brace Spinal Brace: Lumbar corset;Applied in sitting position Restrictions Weight Bearing Restrictions: No   Pertinent Vitals/Pain 8/10 in back-pain button used    ADL  Eating/Feeding: Performed;Independent Where Assessed - Eating/Feeding: Chair Grooming: Performed;Wash/dry face;Teeth care;Set up;Min guard Where Assessed - Grooming: Unsupported standing Upper Body Bathing: Simulated;Set up Where Assessed - Upper Body Bathing: Supported sitting Lower Body Bathing: Simulated;Moderate assistance Where Assessed - Lower Body Bathing: Unsupported sit to stand Upper Body Dressing: Performed;Minimal assistance (don gown) Where Assessed - Upper Body Dressing: Supported sitting Lower Body Dressing: Simulated;Moderate assistance Where Assessed - Lower Body Dressing: Unsupported sit to stand Toilet Transfer: Simulated;Min guard Toilet Transfer Method: Sit to Barista: Other (comment) (chair) Toileting - Clothing Manipulation and Hygiene: Simulated;Set up Where Assessed - Toileting Clothing Manipulation and Hygiene: Sit on 3-in-1 or  toilet Tub/Shower Transfer Method: Not assessed Equipment Used: Rolling walker Transfers/Ambulation Related to ADLs: Pt. min guard ~150' with RW  ADL Comments: Pt. educated on 3/3 back precautions with ADLs and techniques for performing LB ADLs with crossing foot over opposite knee versus use of AE if pt. unable.     OT Diagnosis: Acute pain  OT Problem List: Decreased strength;Decreased activity tolerance;Decreased safety awareness;Decreased knowledge of use of DME or AE;Decreased knowledge of precautions;Pain OT Treatment Interventions: Self-care/ADL training;DME and/or AE instruction;Therapeutic activities;Patient/family education;Balance training   OT Goals Acute Rehab OT Goals OT Goal Formulation: With patient/family Time For Goal Achievement: 10/20/11 Potential to Achieve Goals: Good ADL Goals Pt Will Perform Grooming: with modified independence;Standing at sink ADL Goal: Grooming - Progress: Goal set today Pt Will Perform Lower Body Dressing: with modified independence;Sit to stand from chair;with adaptive equipment ADL Goal: Lower Body Dressing - Progress: Goal set today Pt Will Transfer to Toilet: with modified independence;with DME;Ambulation;3-in-1 ADL Goal: Toilet Transfer - Progress: Goal set today Pt Will Perform Toileting - Hygiene: with modified independence;Sitting on 3-in-1 or toilet;Leaning right and/or left on 3-in-1/toilet ADL Goal: Toileting - Hygiene - Progress: Goal set today Pt Will Perform Tub/Shower Transfer: Tub transfer;with supervision;Ambulation;with DME;Shower seat with back;Transfer tub bench ADL Goal: Web designer - Progress: Goal set today Additional ADL Goal #1: Pt. will recall 3 back precautions ADL Goal: Additional Goal #1 - Progress: Goal set today  Visit Information  Last OT Received On: 10/13/11 Assistance Needed: +1 PT/OT Co-Evaluation/Treatment: Yes    Subjective Data  Subjective: "Sometimes I needed helP' Patient Stated Goal: "Go  home"   Prior Functioning  Home Living Lives With: Friend(s) Available Help at Discharge: Available PRN/intermittently Type of Home: House Home Access: Stairs to enter [employer] of Steps: 1 Entrance Stairs-Rails: None Home Layout: One level Bathroom Shower/Tub:  Tub/shower unit;Curtain Teacher, early years/pre: Yes How Accessible: Accessible via walker Home Adaptive Equipment: Straight cane Prior Function Level of Independence: Needs assistance Needs Assistance: Dressing Dressing:  (sometimes needed help with socks/shoes) Able to Take Stairs?: Yes Driving: Yes Vocation: On disability Communication Communication: No difficulties    Cognition  Overall Cognitive Status: Appears within functional limits for tasks assessed/performed Arousal/Alertness: Awake/alert Orientation Level: Appears intact for tasks assessed Behavior During Session: [facility] for tasks performed    Extremity/Trunk Assessment Right Upper Extremity Assessment RUE ROM/Strength/Tone: Within functional levels RUE Sensation: WFL - Light Touch RUE Coordination: WFL - gross/fine motor Left Upper Extremity Assessment LUE ROM/Strength/Tone: Within functional levels LUE Sensation: WFL - Light Touch LUE Coordination: WFL - gross/fine motor Right Lower Extremity Assessment RLE ROM/Strength/Tone: WFL for tasks assessed RLE Sensation: WFL - Light Touch Left Lower Extremity Assessment LLE ROM/Strength/Tone: WFL for tasks assessed LLE Sensation: WFL - Light Touch Trunk Assessment Trunk Assessment: Normal   Mobility Bed Mobility Bed Mobility: Rolling Right;Right Sidelying to Sit;Sitting - Scoot to Edge of Bed Rolling Right: 5: Supervision;With rail Right Sidelying to Sit: 5: Supervision;With rails Sitting - Scoot to Edge of Bed: 5: Supervision Details for Bed Mobility Assistance: Mod verbal cues for log rolling technique Transfers Transfers: Sit to Stand;Stand to Sit Sit to Stand:  4: Min guard;With upper extremity assist;From bed Stand to Sit: 4: Min guard;With upper extremity assist;To chair/3-in-1 Details for Transfer Assistance: Min verbal cues for hand placement and technique      Balance Balance Balance Assessed: No  End of Session OT - End of Session Equipment Utilized During Treatment: Gait belt;Back brace Activity Tolerance: Patient tolerated treatment well Patient left: in chair;with call bell/phone within reach;with family/visitor present Nurse Communication: Mobility status   Dashan Chizmar, OTR/L Pager [phone] 10/13/2011, 10:14 AM

## 2011-10-14 MED ORDER — OXYCODONE HCL 5 MG PO TABS
10.0000 mg | ORAL_TABLET | ORAL | Status: DC | PRN
  Administered 2011-10-14 – 2011-10-15 (×5): 10 mg via ORAL
  Filled 2011-10-14 (×5): qty 2

## 2011-10-14 MED ORDER — OXYCODONE HCL 20 MG PO TB12
20.0000 mg | ORAL_TABLET | Freq: Two times a day (BID) | ORAL | Status: DC
  Administered 2011-10-14 – 2011-10-15 (×3): 20 mg via ORAL
  Filled 2011-10-14 (×3): qty 1

## 2011-10-14 NOTE — Progress Notes (Signed)
Physical Therapy Treatment Patient Details Name: KENDRIC SINDELAR MRN: [id number] DOB: 1965-10-02 Today's Date: 10/14/2011 Time: 4098-1191 PT Time Calculation (min): 35 min  PT Assessment / Plan / Recommendation Comments on Treatment Session  pt presents s/p L4-S1 Lami and Decompression.  pt notes difficulty controlling pain, however moving quite well.      Follow Up Recommendations  Outpatient PT;Supervision - Intermittent    Barriers to Discharge        Equipment Recommendations  Rolling walker with 5" wheels;Tub/shower bench (Wife to see if she can borrow one.  )    Recommendations for Other Services    Frequency Min 5X/week   Plan Discharge plan remains appropriate;Frequency remains appropriate    Precautions / Restrictions Precautions Precautions: Back Precaution Comments: pt verbalized back precautions.   Required Braces or Orthoses: Spinal Brace Spinal Brace: Lumbar corset;Applied in sitting position Restrictions Weight Bearing Restrictions: No   Pertinent Vitals/Pain 9/10 in low back.  Pt premedicated.      Mobility  Bed Mobility Bed Mobility: Sit to Supine Sit to Supine: 6: Modified independent (Device/Increase time);With rail;HOB flat Sit to Sidelying Left: 6: Modified independent (Device/Increase time) Details for Bed Mobility Assistance: demos good technique, just needs increased time.   Transfers Transfers: Sit to Stand;Stand to Sit Sit to Stand: 5: Supervision;With upper extremity assist;From bed;From chair/3-in-1 Stand to Sit: 5: Supervision;To chair/3-in-1 Details for Transfer Assistance: Min verbal cues for hand placement on RW Ambulation/Gait Ambulation/Gait Assistance: 5: Supervision Ambulation Distance (Feet): 250 Feet Assistive device: Rolling walker Ambulation/Gait Assistance Details: demos good use of RW Gait Pattern: Step-through pattern;Decreased stride length;Trunk flexed Stairs: Yes Stairs Assistance: 5: Supervision Stairs Assistance Details  (indicate cue type and reason): cues for stair gait and to slow down Stair Management Technique: No rails;Forwards Number of Stairs: 1  Wheelchair Mobility Wheelchair Mobility: No    Exercises     PT Diagnosis:    PT Problem List:   PT Treatment Interventions:     PT Goals Acute Rehab PT Goals Time For Goal Achievement: 10/20/11 PT Goal: Sit to Supine/Side - Progress: Progressing toward goal PT Goal: Sit to Stand - Progress: Met PT Goal: Stand to Sit - Progress: Met PT Goal: Ambulate - Progress: Progressing toward goal PT Goal: Up/Down Stairs - Progress: Met Additional Goals PT Goal: Additional Goal #1 - Progress: Partly met  Visit Information  Last PT Received On: 10/14/11 Assistance Needed: +1 PT/OT Co-Evaluation/Treatment: Yes    Subjective Data  Subjective: I'm in a lot of pain this morning.     Cognition  Overall Cognitive Status: Appears within functional limits for tasks assessed/performed Arousal/Alertness: Awake/alert Orientation Level: Appears intact for tasks assessed Behavior During Session: [facility] for tasks performed    Balance  Balance Balance Assessed: No  End of Session PT - End of Session Equipment Utilized During Treatment: Gait belt;Back brace Activity Tolerance: Patient tolerated treatment well Patient left: in bed;with call bell/phone within reach Nurse Communication: Mobility status    Sunny Schlein, [place] [phone] 10/14/2011, 9:17 AM

## 2011-10-14 NOTE — Progress Notes (Signed)
Occupational Therapy Treatment Patient Details Name: Darrell Ramirez MRN: [id number] DOB: 07/29/1965 Today's Date: 10/14/2011 Time: 5621-3086 OT Time Calculation (min): 34 min  OT Assessment / Plan / Recommendation Comments on Treatment Session Pt. progressing well with ADLs and moving well for D/C home when medically able.    Follow Up Recommendations  No OT follow up;Supervision - Intermittent       Equipment Recommendations  3 in 1 bedside comode;Tub/shower bench;Rolling walker with 5" wheels       Frequency Min 2X/week   Plan Discharge plan remains appropriate    Precautions / Restrictions Precautions Precautions: Back Precaution Comments: pt verbalized back precautions.   Required Braces or Orthoses: Spinal Brace Spinal Brace: Lumbar corset;Applied in sitting position Restrictions Weight Bearing Restrictions: No   Pertinent Vitals/Pain 9/10 in back    ADL  Lower Body Bathing: Simulated;Set up (crossing foot over opposite knee) Where Assessed - Lower Body Bathing: Supported sitting Lower Body Dressing: Simulated;Minimal assistance Where Assessed - Lower Body Dressing: Unsupported sitting Toilet Transfer: Performed;Supervision/safety Toilet Transfer Method: Sit to Barista: Bedside commode Tub/Shower Transfer: Simulated;Min guard Tub/Shower Transfer Method: Science writer: Counsellor Used: Rolling walker Transfers/Ambulation Related to ADLs: Pt. supervision ~150' with RW ADL Comments: Pt. able to recall 3/3 back precautions with ADLs and demonstrated ability to complete LB ADLs by crossing foot over oppoiste knee. Educated pt. on tub transfer techniques with use of seat versus bench pending which DME pt. will choose.      OT Goals Acute Rehab OT Goals OT Goal Formulation: With patient/family Time For Goal Achievement: 10/20/11 Potential to Achieve Goals: Good ADL Goals Pt Will Perform Grooming:  with modified independence;Standing at sink ADL Goal: Grooming - Progress: Progressing toward goals Pt Will Transfer to Toilet: with modified independence;with DME;Ambulation;3-in-1 ADL Goal: Toilet Transfer - Progress: Progressing toward goals Pt Will Perform Toileting - Hygiene: with modified independence;Sitting on 3-in-1 or toilet;Leaning right and/or left on 3-in-1/toilet ADL Goal: Toileting - Hygiene - Progress: Progressing toward goals Pt Will Perform Tub/Shower Transfer: Tub transfer;with supervision;Ambulation;with DME;Shower seat with back;Transfer tub bench ADL Goal: Web designer - Progress: Progressing toward goals Additional ADL Goal #1: Pt. will recall 3 back precautions ADL Goal: Additional Goal #1 - Progress: Met  Visit Information  Last OT Received On: 10/14/11 Assistance Needed: +1 PT/OT Co-Evaluation/Treatment: Yes          Cognition  Overall Cognitive Status: Appears within functional limits for tasks assessed/performed Arousal/Alertness: Awake/alert Orientation Level: Appears intact for tasks assessed Behavior During Session: [facility] for tasks performed    Mobility Bed Mobility Bed Mobility: Sit to Supine Sit to Supine: 6: Modified independent (Device/Increase time);With rail;HOB flat Sit to Sidelying Left: 6: Modified independent (Device/Increase time) Details for Bed Mobility Assistance: demos good technique, just needs increased time.   Transfers Transfers: Sit to Stand;Stand to Sit Sit to Stand: 5: Supervision;With upper extremity assist;From bed;From chair/3-in-1 Stand to Sit: 5: Supervision;To chair/3-in-1 Details for Transfer Assistance: Min verbal cues for hand placement on RW   Exercises    Balance Balance Balance Assessed: No  End of Session OT - End of Session Equipment Utilized During Treatment: Gait belt;Back brace Activity Tolerance: Patient tolerated treatment well Patient left: with call bell/phone within reach;with family/visitor  present;in bed Nurse Communication: Mobility status   Darrell Ramirez, OTR/L Pager [phone] 10/14/2011, 9:19 AM

## 2011-10-14 NOTE — Progress Notes (Signed)
Patient ID: Darrell Ramirez, male   DOB: May 24, 1965, 46 y.o.   MRN: [id number] Vital signs are stable. Patient complains of pain management has been poor. He has been receiving dilated 4 mg every 3 hours for pain in addition to Toradol and this does not seem to be adequate. He notes that he gets as good pain relief with Valium which she has been using 5 mg every 6 hours and this has been increased to every 4 hours.  His incision is clean and dry today his motor function appears good in major groups including the iliopsoas quadriceps tibialis anterior and gastrocs  I discussed at some length his pain management will place him on OxyContin 20 mg twice a day in addition to oxycodone 10 mg every 4 hours to help with breakthrough pain. He does not want Percocet as he states he is allergic to it. Particularly he is allergic to Tylenol component of it.  Continue to encourage mobilization. Plan discharge in near future.

## 2011-10-15 MED ORDER — OXYCODONE HCL 10 MG PO TABS: 10.0000 mg | ORAL_TABLET | ORAL | Status: AC | PRN

## 2011-10-15 MED ORDER — OXYCODONE HCL 20 MG PO TB12: 20.0000 mg | ORAL_TABLET | Freq: Two times a day (BID) | ORAL | Status: DC

## 2011-10-15 MED ORDER — DIAZEPAM 5 MG PO TABS: 5.0000 mg | ORAL_TABLET | ORAL | Status: AC | PRN

## 2011-10-15 NOTE — Discharge Summary (Signed)
Physician Discharge Summary  Patient ID: Darrell Ramirez MRN: [id number] DOB/AGE: [birth year] 46 y.o.  Admit date: 10/12/2011 Discharge date: 10/15/2011  Admission Diagnoses: Lumbar spondylosis and stenosis L4-L5 and L5-S1 with recurrent herniated nucleus pulposus, lumbar radiculopathy  Discharge Diagnoses: Lumbar spondylosis and stenosis L4-L5 and L5-S1 with recurrent herniated nucleus pulposus, lumbar radiculopathy Active Problems:  * No active hospital problems. *    Discharged Condition: good  Hospital Course: Patient was admitted on 10/12/2011 to undergo surgical decompression and stabilization of the lower lumbar spine from L4 to the sacrum he tolerated his surgery well. Ain't control issues have been addressed the patient was initially on Dilaudid PCA pump this is been changed to OxyContin and oxycodone. He is discharged on these medications.  Consults: None  Significant Diagnostic Studies: None  Treatments: Decompression of L4-5 and L5-S1 with total discectomy decompression of L4-L5 and S1 nerve roots posterior interbody arthrodesis L4 to sacrum with pedicle screw fixation.  Discharge Exam: Blood pressure 128/76, pulse 88, temperature 98.8 F (37.1 C), temperature source Oral, resp. rate 18, height 6' (1.829 m), weight 90.5 kg (199 lb 8.3 oz), SpO2 94.00%. Station and gait are intact. Motor function in iliopsoas quadriceps tibialis anterior and gastrocs is intact. Incision is clean and dry.  Disposition: 01-Home or Self Care  Discharge Orders    Future Orders Please Complete By Expires   Walker rolling      Commode chair      Diet - low sodium heart healthy      Increase activity slowly      Discharge instructions      Comments:   Okay to shower. Do not apply salves or appointments to incision. No heavy lifting with the upper extremities greater than 15 pounds. May resume driving when not requiring pain medication and patient feels comfortable with doing so.   Call MD for:   redness, tenderness, or signs of infection (pain, swelling, redness, odor or green/yellow discharge around incision site)      Call MD for:  severe uncontrolled pain      Call MD for:  temperature >100.4        Medication List  As of 10/15/2011  9:10 AM   STOP taking these medications         ibuprofen 200 MG tablet      indomethacin 50 MG capsule         TAKE these medications         diazepam 5 MG tablet   Commonly known as: VALIUM   Take 1 tablet (5 mg total) by mouth every 4 (four) hours as needed (Muscle spasm).      KRILL OIL PO   Take 1 capsule by mouth daily.      oxyCODONE 5 MG immediate release tablet   Commonly known as: Oxy IR/ROXICODONE   Take 10 mg by mouth 3 (three) times daily as needed. For severe pain      oxyCODONE 20 MG 12 hr tablet   Commonly known as: OXYCONTIN   Take 1 tablet (20 mg total) by mouth every 12 (twelve) hours.      Oxycodone HCl 10 MG Tabs   Take 1 tablet (10 mg total) by mouth every 3 (three) hours as needed for pain.             SignedStefani Dama 10/15/2011, 9:10 AM

## 2011-10-15 NOTE — Progress Notes (Signed)
Physical Therapy Treatment Patient Details Name: Darrell Ramirez MRN: [id number] DOB: Feb 08, 1966 Today's Date: 10/15/2011 Time: 8295-6213 PT Time Calculation (min): 16 min  PT Assessment / Plan / Recommendation Comments on Treatment Session       Follow Up Recommendations  Outpatient PT;Supervision - Intermittent    Barriers to Discharge        Equipment Recommendations  Rolling walker with 5" wheels;3 in 1 bedside comode;Tub/shower seat    Recommendations for Other Services    Frequency Min 5X/week   Plan Discharge plan remains appropriate;Frequency remains appropriate    Precautions / Restrictions Precautions Precautions: Back Precaution Comments: Patient able to recall all precautions  Spinal Brace: Lumbar corset;Applied in sitting position   Pertinent Vitals/Pain     Mobility  Transfers Sit to Stand: 6: Modified independent (Device/Increase time) Stand to Sit: 6: Modified independent (Device/Increase time) Ambulation/Gait Ambulation/Gait Assistance: 6: Modified independent (Device/Increase time) Ambulation Distance (Feet): 300 Feet Assistive device: Rolling walker Ambulation/Gait Assistance Details: good use of RW and posture Gait Pattern: Step-through pattern Stairs: Yes Stairs Assistance: 5: Supervision Stair Management Technique: No rails Number of Stairs: 2     Exercises     PT Diagnosis:    PT Problem List:   PT Treatment Interventions:     PT Goals Acute Rehab PT Goals PT Goal: Sit to Stand - Progress: Met PT Goal: Stand to Sit - Progress: Met PT Goal: Ambulate - Progress: Met PT Goal: Up/Down Stairs - Progress: Met  Visit Information  Last PT Received On: 10/15/11 Assistance Needed: +1    Subjective Data      Cognition  Overall Cognitive Status: Appears within functional limits for tasks assessed/performed Arousal/Alertness: Awake/alert Orientation Level: Appears intact for tasks assessed Behavior During Session: [facility] for tasks performed    Balance     End of Session PT - End of Session Equipment Utilized During Treatment: Gait belt;Back brace Activity Tolerance: Patient tolerated treatment well Patient left: in bed;with call bell/phone within reach Nurse Communication: Mobility status    Fredrich Birks 10/15/2011, 12:53 PM 10/15/2011 Fredrich Birks PTA [phone] pager [phone] office

## 2011-10-15 NOTE — Progress Notes (Signed)
Discharge instructions given to wife and pt. 1058 discharged to home. Transported to family car via wheelchair

## 2011-12-15 ENCOUNTER — Other Ambulatory Visit: Payer: Self-pay | Admitting: Neurological Surgery

## 2011-12-15 DIAGNOSIS — M48061 Spinal stenosis, lumbar region without neurogenic claudication: Secondary | ICD-10-CM

## 2011-12-30 ENCOUNTER — Other Ambulatory Visit: Payer: Medicaid Other

## 2012-01-12 ENCOUNTER — Other Ambulatory Visit: Payer: Medicaid Other

## 2012-01-18 ENCOUNTER — Ambulatory Visit
Admission: RE | Admit: 2012-01-18 | Discharge: 2012-01-18 | Disposition: A | Discharge status: Home / Self Care | Payer: Medicaid Other | Source: Ambulatory Visit | Attending: Neurological Surgery | Admitting: Neurological Surgery

## 2012-01-18 DIAGNOSIS — M48061 Spinal stenosis, lumbar region without neurogenic claudication: Secondary | ICD-10-CM

## 2012-02-01 ENCOUNTER — Other Ambulatory Visit: Payer: Self-pay | Admitting: Neurological Surgery

## 2012-02-01 DIAGNOSIS — M542 Cervicalgia: Secondary | ICD-10-CM

## 2012-02-04 ENCOUNTER — Other Ambulatory Visit: Payer: Self-pay | Admitting: Neurological Surgery

## 2012-02-04 ENCOUNTER — Ambulatory Visit
Admission: RE | Admit: 2012-02-04 | Discharge: 2012-02-04 | Disposition: A | Discharge status: Home / Self Care | Payer: Medicaid Other | Source: Ambulatory Visit | Attending: Neurological Surgery | Admitting: Neurological Surgery

## 2012-02-04 DIAGNOSIS — M542 Cervicalgia: Secondary | ICD-10-CM

## 2012-02-16 ENCOUNTER — Other Ambulatory Visit: Payer: Self-pay | Admitting: Neurological Surgery

## 2012-02-22 ENCOUNTER — Encounter (HOSPITAL_COMMUNITY): Payer: Self-pay | Admitting: Pharmacy Technician

## 2012-02-26 ENCOUNTER — Encounter (HOSPITAL_COMMUNITY): Payer: Self-pay | Admitting: *Deleted

## 2012-02-28 MED ORDER — CEFAZOLIN SODIUM-DEXTROSE 2-3 GM-% IV SOLR
2.0000 g | INTRAVENOUS | Status: AC
  Administered 2012-02-29: 2 g via INTRAVENOUS
  Filled 2012-02-28: qty 50

## 2012-02-29 ENCOUNTER — Encounter (HOSPITAL_COMMUNITY): Payer: Self-pay | Admitting: Anesthesiology

## 2012-02-29 ENCOUNTER — Ambulatory Visit (HOSPITAL_COMMUNITY): Payer: Medicaid Other

## 2012-02-29 ENCOUNTER — Encounter (HOSPITAL_COMMUNITY)
Admission: RE | Disposition: A | Discharge status: Home / Self Care | Payer: Self-pay | Source: Ambulatory Visit | Attending: Neurological Surgery

## 2012-02-29 ENCOUNTER — Encounter (HOSPITAL_COMMUNITY): Payer: Self-pay | Admitting: *Deleted

## 2012-02-29 ENCOUNTER — Ambulatory Visit (HOSPITAL_COMMUNITY): Payer: Medicaid Other | Admitting: Anesthesiology

## 2012-02-29 ENCOUNTER — Ambulatory Visit (HOSPITAL_COMMUNITY)
Admission: RE | Admit: 2012-02-29 | Discharge: 2012-03-01 | Disposition: A | Discharge status: Home / Self Care | Payer: Medicaid Other | Source: Ambulatory Visit | Attending: Neurological Surgery | Admitting: Neurological Surgery

## 2012-02-29 DIAGNOSIS — F17200 Nicotine dependence, unspecified, uncomplicated: Secondary | ICD-10-CM | POA: Insufficient documentation

## 2012-02-29 DIAGNOSIS — M4722 Other spondylosis with radiculopathy, cervical region: Secondary | ICD-10-CM

## 2012-02-29 DIAGNOSIS — M4712 Other spondylosis with myelopathy, cervical region: Secondary | ICD-10-CM | POA: Insufficient documentation

## 2012-02-29 HISTORY — PX: ANTERIOR CERVICAL DECOMP/DISCECTOMY FUSION: SHX1161

## 2012-02-29 HISTORY — DX: Constipation, unspecified: K59.00

## 2012-02-29 HISTORY — DX: Unspecified osteoarthritis, unspecified site: M19.90

## 2012-02-29 LAB — CBC
Platelets: 327 10*3/uL (ref 150–400)
RBC: 5.12 MIL/uL (ref 4.22–5.81)
RDW: 14.2 % (ref 11.5–15.5)
WBC: 10.2 10*3/uL (ref 4.0–10.5)

## 2012-02-29 LAB — SURGICAL PCR SCREEN
MRSA, PCR: NEGATIVE
Staphylococcus aureus: NEGATIVE

## 2012-02-29 SURGERY — ANTERIOR CERVICAL DECOMPRESSION/DISCECTOMY FUSION 3 LEVELS
Anesthesia: General | Site: Spine Cervical | Wound class: Clean

## 2012-02-29 MED ORDER — NEOSTIGMINE METHYLSULFATE 1 MG/ML IJ SOLN
INTRAMUSCULAR | Status: DC | PRN
  Administered 2012-02-29: 3 mg via INTRAVENOUS

## 2012-02-29 MED ORDER — BACITRACIN 50000 UNITS IM SOLR
INTRAMUSCULAR | Status: AC
  Filled 2012-02-29: qty 1

## 2012-02-29 MED ORDER — LIDOCAINE-EPINEPHRINE 1 %-1:100000 IJ SOLN
INTRAMUSCULAR | Status: DC | PRN
  Administered 2012-02-29: 3 mL

## 2012-02-29 MED ORDER — OXYCODONE HCL 5 MG PO TABS
ORAL_TABLET | ORAL | Status: AC
  Filled 2012-02-29: qty 1

## 2012-02-29 MED ORDER — OXYCODONE HCL 5 MG/5ML PO SOLN: 5.0000 mg | Freq: Once | ORAL | Status: DC | PRN

## 2012-02-29 MED ORDER — SODIUM CHLORIDE 0.9 % IJ SOLN: 3.0000 mL | INTRAMUSCULAR | Status: DC | PRN

## 2012-02-29 MED ORDER — MIDAZOLAM HCL 2 MG/2ML IJ SOLN: 0.5000 mg | Freq: Once | INTRAMUSCULAR | Status: DC | PRN

## 2012-02-29 MED ORDER — OXYCODONE HCL 5 MG PO TABS
10.0000 mg | ORAL_TABLET | Freq: Four times a day (QID) | ORAL | Status: DC | PRN
  Administered 2012-02-29 – 2012-03-01 (×2): 10 mg via ORAL
  Filled 2012-02-29 (×3): qty 2

## 2012-02-29 MED ORDER — BUPIVACAINE HCL (PF) 0.25 % IJ SOLN
INTRAMUSCULAR | Status: DC | PRN
  Administered 2012-02-29: 3 mL

## 2012-02-29 MED ORDER — ACETAMINOPHEN 650 MG RE SUPP: 650.0000 mg | RECTAL | Status: DC | PRN

## 2012-02-29 MED ORDER — LIDOCAINE HCL (CARDIAC) 20 MG/ML IV SOLN
INTRAVENOUS | Status: DC | PRN
  Administered 2012-02-29: 50 mg via INTRAVENOUS

## 2012-02-29 MED ORDER — MEPERIDINE HCL 25 MG/ML IJ SOLN: 6.2500 mg | INTRAMUSCULAR | Status: DC | PRN

## 2012-02-29 MED ORDER — HYDROMORPHONE HCL PF 1 MG/ML IJ SOLN
0.2500 mg | INTRAMUSCULAR | Status: DC | PRN
  Administered 2012-02-29 (×4): 0.5 mg via INTRAVENOUS

## 2012-02-29 MED ORDER — ONDANSETRON HCL 4 MG/2ML IJ SOLN: 4.0000 mg | INTRAMUSCULAR | Status: DC | PRN

## 2012-02-29 MED ORDER — OXYCODONE HCL 5 MG/5ML PO SOLN: 5.0000 mg | Freq: Once | ORAL | Status: AC | PRN

## 2012-02-29 MED ORDER — DEXAMETHASONE SODIUM PHOSPHATE 4 MG/ML IJ SOLN
INTRAMUSCULAR | Status: DC | PRN
  Administered 2012-02-29: 10 mg via INTRAVENOUS

## 2012-02-29 MED ORDER — PROMETHAZINE HCL 25 MG/ML IJ SOLN: 6.2500 mg | INTRAMUSCULAR | Status: DC | PRN

## 2012-02-29 MED ORDER — MIDAZOLAM HCL 5 MG/5ML IJ SOLN
INTRAMUSCULAR | Status: DC | PRN
  Administered 2012-02-29 (×2): 2 mg via INTRAVENOUS

## 2012-02-29 MED ORDER — PHENOL 1.4 % MT LIQD
1.0000 | OROMUCOSAL | Status: DC | PRN
  Filled 2012-02-29: qty 177

## 2012-02-29 MED ORDER — OXYCODONE-ACETAMINOPHEN 10-325 MG PO TABS: 1.0000 | ORAL_TABLET | ORAL | Status: DC | PRN

## 2012-02-29 MED ORDER — DIAZEPAM 5 MG PO TABS
ORAL_TABLET | ORAL | Status: AC
  Filled 2012-02-29: qty 1

## 2012-02-29 MED ORDER — DIAZEPAM 5 MG PO TABS
5.0000 mg | ORAL_TABLET | Freq: Four times a day (QID) | ORAL | Status: DC | PRN
  Administered 2012-02-29 – 2012-03-01 (×4): 5 mg via ORAL
  Filled 2012-02-29 (×3): qty 1

## 2012-02-29 MED ORDER — ACETAMINOPHEN 325 MG PO TABS: 650.0000 mg | ORAL_TABLET | ORAL | Status: DC | PRN

## 2012-02-29 MED ORDER — HYDROMORPHONE HCL PF 1 MG/ML IJ SOLN
INTRAMUSCULAR | Status: AC
  Filled 2012-02-29: qty 1

## 2012-02-29 MED ORDER — HYDROMORPHONE HCL PF 1 MG/ML IJ SOLN
0.5000 mg | INTRAMUSCULAR | Status: DC | PRN
  Administered 2012-02-29 – 2012-03-01 (×6): 1 mg via INTRAVENOUS
  Filled 2012-02-29 (×5): qty 1

## 2012-02-29 MED ORDER — OXYCODONE-ACETAMINOPHEN 5-325 MG PO TABS
1.0000 | ORAL_TABLET | ORAL | Status: DC | PRN
  Administered 2012-02-29 – 2012-03-01 (×2): 2 via ORAL
  Filled 2012-02-29 (×2): qty 2

## 2012-02-29 MED ORDER — 0.9 % SODIUM CHLORIDE (POUR BTL) OPTIME
TOPICAL | Status: DC | PRN
  Administered 2012-02-29: 1000 mL

## 2012-02-29 MED ORDER — HYDROMORPHONE HCL PF 1 MG/ML IJ SOLN: 0.2500 mg | INTRAMUSCULAR | Status: DC | PRN

## 2012-02-29 MED ORDER — ONDANSETRON HCL 4 MG/2ML IJ SOLN
INTRAMUSCULAR | Status: DC | PRN
  Administered 2012-02-29: 4 mg via INTRAVENOUS

## 2012-02-29 MED ORDER — CEFAZOLIN SODIUM 1-5 GM-% IV SOLN
1.0000 g | Freq: Three times a day (TID) | INTRAVENOUS | Status: AC
  Administered 2012-02-29 – 2012-03-01 (×2): 1 g via INTRAVENOUS
  Filled 2012-02-29 (×4): qty 50

## 2012-02-29 MED ORDER — GLYCOPYRROLATE 0.2 MG/ML IJ SOLN
INTRAMUSCULAR | Status: DC | PRN
  Administered 2012-02-29: 0.4 mg via INTRAVENOUS

## 2012-02-29 MED ORDER — SODIUM CHLORIDE 0.9 % IV SOLN: 250.0000 mL | INTRAVENOUS | Status: DC

## 2012-02-29 MED ORDER — SODIUM CHLORIDE 0.9 % IR SOLN
Status: DC | PRN
  Administered 2012-02-29: 11:00:00

## 2012-02-29 MED ORDER — SODIUM CHLORIDE 0.9 % IJ SOLN
3.0000 mL | Freq: Two times a day (BID) | INTRAMUSCULAR | Status: DC
  Administered 2012-02-29 (×2): 3 mL via INTRAVENOUS

## 2012-02-29 MED ORDER — LACTATED RINGERS IV SOLN
INTRAVENOUS | Status: DC | PRN
  Administered 2012-02-29 (×3): via INTRAVENOUS

## 2012-02-29 MED ORDER — OXYCODONE HCL 5 MG PO TABS: 5.0000 mg | ORAL_TABLET | Freq: Once | ORAL | Status: DC | PRN

## 2012-02-29 MED ORDER — THROMBIN 20000 UNITS EX SOLR
CUTANEOUS | Status: DC | PRN
  Administered 2012-02-29: 11:00:00 via TOPICAL

## 2012-02-29 MED ORDER — DIAZEPAM 5 MG PO TABS: 5.0000 mg | ORAL_TABLET | Freq: Four times a day (QID) | ORAL | Status: DC | PRN

## 2012-02-29 MED ORDER — ROCURONIUM BROMIDE 100 MG/10ML IV SOLN
INTRAVENOUS | Status: DC | PRN
  Administered 2012-02-29: 50 mg via INTRAVENOUS
  Administered 2012-02-29: 20 mg via INTRAVENOUS

## 2012-02-29 MED ORDER — HYDROMORPHONE HCL PF 1 MG/ML IJ SOLN
INTRAMUSCULAR | Status: DC | PRN
  Administered 2012-02-29 (×2): 1 mg via INTRAVENOUS

## 2012-02-29 MED ORDER — SODIUM CHLORIDE 0.9 % IV SOLN
INTRAVENOUS | Status: AC
  Filled 2012-02-29: qty 500

## 2012-02-29 MED ORDER — FENTANYL CITRATE 0.05 MG/ML IJ SOLN
INTRAMUSCULAR | Status: DC | PRN
  Administered 2012-02-29 (×2): 100 ug via INTRAVENOUS
  Administered 2012-02-29: 50 ug via INTRAVENOUS
  Administered 2012-02-29: 250 ug via INTRAVENOUS
  Administered 2012-02-29: 50 ug via INTRAVENOUS
  Administered 2012-02-29 (×2): 100 ug via INTRAVENOUS

## 2012-02-29 MED ORDER — MUPIROCIN 2 % EX OINT
TOPICAL_OINTMENT | Freq: Two times a day (BID) | CUTANEOUS | Status: DC
  Administered 2012-02-29: 08:00:00 via NASAL
  Filled 2012-02-29 (×2): qty 22

## 2012-02-29 MED ORDER — OXYCODONE HCL 5 MG PO TABS
5.0000 mg | ORAL_TABLET | Freq: Once | ORAL | Status: AC | PRN
  Administered 2012-02-29: 5 mg via ORAL

## 2012-02-29 MED ORDER — PROPOFOL 10 MG/ML IV BOLUS
INTRAVENOUS | Status: DC | PRN
  Administered 2012-02-29: 150 mg via INTRAVENOUS

## 2012-02-29 MED ORDER — ACETAMINOPHEN 10 MG/ML IV SOLN
INTRAVENOUS | Status: AC
  Administered 2012-02-29: 1000 mg via INTRAVENOUS
  Filled 2012-02-29: qty 100

## 2012-02-29 MED ORDER — MENTHOL 3 MG MT LOZG
1.0000 | LOZENGE | OROMUCOSAL | Status: DC | PRN
  Filled 2012-02-29: qty 9

## 2012-02-29 SURGICAL SUPPLY — 66 items
ADH SKN CLS APL DERMABOND .7 (GAUZE/BANDAGES/DRESSINGS)
ADH SKN CLS LQ APL DERMABOND (GAUZE/BANDAGES/DRESSINGS) ×1
BAG DECANTER FOR FLEXI CONT (MISCELLANEOUS) ×2 IMPLANT
BANDAGE GAUZE ELAST BULKY 4 IN (GAUZE/BANDAGES/DRESSINGS) ×2 IMPLANT
BIT DRILL 14MM (INSTRUMENTS) IMPLANT
BIT DRILL NEURO 2X3.1 SFT TUCH (MISCELLANEOUS) ×1 IMPLANT
BUR BARREL STRAIGHT FLUTE 4.0 (BURR) ×2 IMPLANT
CANISTER SUCTION 2500CC (MISCELLANEOUS) ×2 IMPLANT
CLOTH BEACON ORANGE TIMEOUT ST (SAFETY) ×2 IMPLANT
CONT SPEC 4OZ CLIKSEAL STRL BL (MISCELLANEOUS) ×2 IMPLANT
DECANTER SPIKE VIAL GLASS SM (MISCELLANEOUS) ×2 IMPLANT
DERMABOND ADHESIVE PROPEN (GAUZE/BANDAGES/DRESSINGS) ×1
DERMABOND ADVANCED (GAUZE/BANDAGES/DRESSINGS)
DERMABOND ADVANCED .7 DNX12 (GAUZE/BANDAGES/DRESSINGS) ×1 IMPLANT
DERMABOND ADVANCED .7 DNX6 (GAUZE/BANDAGES/DRESSINGS) IMPLANT
DRAPE LAPAROTOMY 100X72 PEDS (DRAPES) ×2 IMPLANT
DRAPE MICROSCOPE LEICA (MISCELLANEOUS) IMPLANT
DRAPE POUCH INSTRU U-SHP 10X18 (DRAPES) ×2 IMPLANT
DRAPE PROXIMA HALF (DRAPES) ×1 IMPLANT
DRESSING TELFA 8X3 (GAUZE/BANDAGES/DRESSINGS) ×2 IMPLANT
DRILL 14MM (INSTRUMENTS) ×2
DRILL NEURO 2X3.1 SOFT TOUCH (MISCELLANEOUS) ×2
DRSG OPSITE 4X5.5 SM (GAUZE/BANDAGES/DRESSINGS) ×2 IMPLANT
DURAPREP 6ML APPLICATOR 50/CS (WOUND CARE) ×2 IMPLANT
ELECT REM PT RETURN 9FT ADLT (ELECTROSURGICAL) ×2
ELECTRODE REM PT RTRN 9FT ADLT (ELECTROSURGICAL) ×1 IMPLANT
GAUZE SPONGE 4X4 16PLY XRAY LF (GAUZE/BANDAGES/DRESSINGS) IMPLANT
GLOVE BIO SURGEON STRL SZ7.5 (GLOVE) IMPLANT
GLOVE BIO SURGEON STRL SZ8 (GLOVE) ×1 IMPLANT
GLOVE BIOGEL PI IND STRL 7.5 (GLOVE) IMPLANT
GLOVE BIOGEL PI IND STRL 8 (GLOVE) IMPLANT
GLOVE BIOGEL PI IND STRL 8.5 (GLOVE) ×1 IMPLANT
GLOVE BIOGEL PI INDICATOR 7.5 (GLOVE) ×1
GLOVE BIOGEL PI INDICATOR 8 (GLOVE) ×3
GLOVE BIOGEL PI INDICATOR 8.5 (GLOVE) ×1
GLOVE ECLIPSE 7.5 STRL STRAW (GLOVE) ×6 IMPLANT
GLOVE ECLIPSE 8.5 STRL (GLOVE) ×2 IMPLANT
GLOVE EXAM NITRILE LRG STRL (GLOVE) IMPLANT
GLOVE EXAM NITRILE MD LF STRL (GLOVE) ×1 IMPLANT
GLOVE EXAM NITRILE XL STR (GLOVE) IMPLANT
GLOVE EXAM NITRILE XS STR PU (GLOVE) IMPLANT
GLOVE INDICATOR 8.5 STRL (GLOVE) ×1 IMPLANT
GOWN BRE IMP SLV AUR LG STRL (GOWN DISPOSABLE) IMPLANT
GOWN BRE IMP SLV AUR XL STRL (GOWN DISPOSABLE) ×3 IMPLANT
GOWN STRL REIN 2XL LVL4 (GOWN DISPOSABLE) ×4 IMPLANT
HEAD HALTER (SOFTGOODS) ×1 IMPLANT
KIT BASIN OR (CUSTOM PROCEDURE TRAY) ×2 IMPLANT
KIT ROOM TURNOVER OR (KITS) ×2 IMPLANT
NDL SPNL 22GX3.5 QUINCKE BK (NEEDLE) ×1 IMPLANT
NEEDLE HYPO 22GX1.5 SAFETY (NEEDLE) ×2 IMPLANT
NEEDLE SPNL 22GX3.5 QUINCKE BK (NEEDLE) ×2 IMPLANT
NS IRRIG 1000ML POUR BTL (IV SOLUTION) ×2 IMPLANT
PACK LAMINECTOMY NEURO (CUSTOM PROCEDURE TRAY) ×2 IMPLANT
PAD ARMBOARD 7.5X6 YLW CONV (MISCELLANEOUS) ×6 IMPLANT
PLATE 48MM (Plate) ×1 IMPLANT
PUTTY DBM 5CC ×1 IMPLANT
RUBBERBAND STERILE (MISCELLANEOUS) IMPLANT
SCREW 14MM (Screw) ×8 IMPLANT
SPACER PAR CORT CERV S (Spacer) ×3 IMPLANT
SPONGE INTESTINAL PEANUT (DISPOSABLE) ×2 IMPLANT
SPONGE SURGIFOAM ABS GEL 100 (HEMOSTASIS) ×2 IMPLANT
SUT VIC AB 3-0 SH 8-18 (SUTURE) ×3 IMPLANT
SYR 20ML ECCENTRIC (SYRINGE) ×2 IMPLANT
TOWEL OR 17X24 6PK STRL BLUE (TOWEL DISPOSABLE) ×2 IMPLANT
TOWEL OR 17X26 10 PK STRL BLUE (TOWEL DISPOSABLE) ×2 IMPLANT
WATER STERILE IRR 1000ML POUR (IV SOLUTION) ×2 IMPLANT

## 2012-02-29 NOTE — Anesthesia Preprocedure Evaluation (Signed)
Anesthesia Evaluation  Patient identified by MRN, date of birth, ID band Patient awake    Reviewed: Allergy & Precautions, H&P , NPO status , Patient's Chart, lab work & pertinent test results  History of Anesthesia Complications Negative for: history of anesthetic complications  Airway Mallampati: I TM Distance: >3 FB Neck ROM: Full    Dental No notable dental hx. (+) Dental Advisory Given, Teeth Intact and Caps   Pulmonary neg sleep apnea, Current Smoker,  breath sounds clear to auscultation  Pulmonary exam normal       Cardiovascular negative cardio ROS  Rhythm:Regular Rate:Normal     Neuro/Psych Chronic back pain: narcotic dependent    GI/Hepatic Neg liver ROS, GERD-  Medicated and Controlled,  Endo/Other  negative endocrine ROS  Renal/GU negative Renal ROS     Musculoskeletal   Abdominal   Peds  Hematology negative hematology ROS (+)   Anesthesia Other Findings   Reproductive/Obstetrics                           Anesthesia Physical Anesthesia Plan  ASA: II  Anesthesia Plan: General   Post-op Pain Management:    Induction: Intravenous  Airway Management Planned: Oral ETT  Additional Equipment:   Intra-op Plan:   Post-operative Plan: Extubation in OR  Informed Consent: I have reviewed the patients History and Physical, chart, labs and discussed the procedure including the risks, benefits and alternatives for the proposed anesthesia with the patient or authorized representative who has indicated his/her understanding and acceptance.   Dental advisory given  Plan Discussed with: CRNA and Surgeon  Anesthesia Plan Comments: (Plan routine monitors, GETA)        Anesthesia Quick Evaluation

## 2012-02-29 NOTE — Progress Notes (Signed)
Patient ID: Darrell Ramirez, male   DOB: 10-26-1965, 46 y.o.   MRN: [id number] Vital signs stable motor function intact incision is clean and dry.  Swallowing not a problem.  Plan discharge in morning.

## 2012-02-29 NOTE — Transfer of Care (Signed)
Immediate Anesthesia Transfer of Care Note  Patient: Darrell Ramirez  Procedure(s) Performed: Procedure(s) (LRB) with comments: ANTERIOR CERVICAL DECOMPRESSION/DISCECTOMY FUSION 3 LEVELS (N/A) - Cervical Four-Five, Cervical Five-Six,Cervical Six-Seven Anterior cervical decompression/diskectomy/fusion  Patient Location: PACU  Anesthesia Type:General  Level of Consciousness: awake, alert , oriented and patient cooperative  Airway & Oxygen Therapy: Patient Spontanous Breathing and Patient connected to nasal cannula oxygen  Post-op Assessment: Report given to PACU RN, Post -op Vital signs reviewed and stable and Patient moving all extremities  Post vital signs: Reviewed and stable  Complications: No apparent anesthesia complications

## 2012-02-29 NOTE — H&P (Signed)
Darrell Ramirez is an 46 y.o. male.   Chief Complaint: Neck shoulder and left arm pain HPI: Darrell Ramirez  #[id number]  DOB:  [birth year]  02/10/2012:  Darrell Ramirez is admitted regarding his cervical spine.  He has been having neck and shoulder discomfort bilaterally that has been unrelenting.  Though is back is feeling better, he is still having considerable difficulty particularly in his left shoulder, but he also gets pain out into his right shoulder and arm proximally.  An MRI was performed and the study is reviewed .  It demonstrates that Darrell Ramirez has evidence of advanced degenerative changes at C4-C5 with a disc herniation in the right foramen causing compromise of the right C5 nerve root.  At C5-C6 he has moderately severe biforaminal stenosis secondary to uncinate hypertrophy and disc space collapse.  At C6-C7 he again has another right-sided disc herniation with effacement of the ventral aspect of the cord on the right side also noted.  I reviewed the findings to him.  We had previously performed some plain radiographs which demonstrate that Darrell Ramirez has cervical straightening and loss of his normal lordotic curve.  He has advanced degenerative change at the C5-6 and C6-7 levels.  Based on the current study, I would advise that Darrell Ramirez may ultimately need to consider a 3-level anterior decompression and arthrodesis.  This is totally dependent on the severity of his symptoms.    His exam today demonstrates that he has a limited range of motion turning only 45 degrees left and right and limiting flexion to about 50% of normal with extension being limited similarly.  I noted on his strength testing that his deltoid, his bicep, his tricep and his grip have reasonably good strength, though his reflexes are absent in the bicep and tricep both.    The main reason for proceeding would be because the pain is not being managed.  Darrell Ramirez had poor experience with epidural steroid injections in his back not receiving much relief at all.  I noted to  him that cervical epidural steroids are even less effective in that regard.  He is not interested in pursuing a lot of conservative management.  Ultimately, he will need to consider surgical intervention.  He is eager to proceed with the surgery at this point in an effort to get on with his life.  We discussed the various risks including the major ones to the esophagus, the trachea and the voice box and also the potential for blood clot.  Darrell Ramirez understands these things.  We are hoping, however, if we can get him a good result with this, he would be able to return himself to the workplace in the next number of months.  He is admitted for surgery today.    Past Medical History  Diagnosis Date  . GERD (gastroesophageal reflux disease) otc mrdication  . Sleep apnea     has not been tested.    . Constipation   . Arthritis     Past Surgical History  Procedure Date  . Arthroscopic knee     times 2 right and left  . Back surgery x 3  . Hand surgery     nerve repair from laceration    History reviewed. No pertinent family history. Social History:  reports that he has been smoking Cigarettes.  He has a 10 pack-year smoking history. He does not have any smokeless tobacco history on file. He reports that he drinks about 1.2 ounces of alcohol per week. He reports that he  does not use illicit drugs.  Allergies:  Allergies  Allergen Reactions  . Vicodin (Hydrocodone-Acetaminophen) Itching  . Tylenol Pm Extra (Diphenhydramine-Apap (Sleep))     Insomnia     Medications Prior to Admission  Medication Sig Dispense Refill  . oxyCODONE (OXY IR/ROXICODONE) 5 MG immediate release tablet Take 10 mg by mouth every 6 (six) hours as needed. For severe pain        Results for orders placed during the hospital encounter of 02/29/12 (from the past 48 hour(s))  CBC     Status: Normal   Collection Time   02/29/12  7:11 AM      Component Value Range Comment   WBC 10.2  4.0 - 10.5 K/uL    RBC 5.12  4.22 -  5.81 MIL/uL    Hemoglobin 15.2  13.0 - 17.0 g/dL    HCT 28.4  13.2 - 44.0 %    MCV 85.5  78.0 - 100.0 fL    MCH 29.7  26.0 - 34.0 pg    MCHC 34.7  30.0 - 36.0 g/dL    RDW 10.2  72.5 - 36.6 %    Platelets 327  150 - 400 K/uL    No results found.  Review of Systems  HENT: Positive for neck pain.   Eyes: Negative.   Respiratory: Negative.   Cardiovascular: Negative.   Gastrointestinal: Negative.   Genitourinary: Negative.   Skin: Negative.   Neurological: Positive for tingling, focal weakness and weakness.       Pain in right shoulder and arm  Endo/Heme/Allergies: Negative.   Psychiatric/Behavioral:       Anxious    Blood pressure 106/74, pulse 77, temperature 98.1 F (36.7 C), temperature source Oral, resp. rate 16, height 6' (1.829 m), weight 87.8 kg (193 lb 9 oz), SpO2 96.00%. Physical Exam  Constitutional: He appears well-developed and well-nourished.  HENT:  Head: Normocephalic and atraumatic.  Eyes: Conjunctivae normal and EOM are normal. Pupils are equal, round, and reactive to light.  Neck:       Decreased range of motion turning 60 to left 45 to the right extension and flexion limited to 50% of normal.  Cardiovascular: Normal rate and regular rhythm.   Respiratory: Breath sounds normal.  GI: Bowel sounds are normal.  Musculoskeletal:       Motor function is decreased in the left bicep tricep and grip 4/5  Neurological: He is alert. He has normal reflexes.  Skin: Skin is warm and dry.  Psychiatric: He has a normal mood and affect. His behavior is normal. Judgment and thought content normal.     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  [name] J 02/29/2012, 9:27 AM

## 2012-02-29 NOTE — Anesthesia Postprocedure Evaluation (Signed)
  Anesthesia Post-op Note  Patient: Darrell Ramirez  Procedure(s) Performed: Procedure(s) (LRB) with comments: ANTERIOR CERVICAL DECOMPRESSION/DISCECTOMY FUSION 3 LEVELS (N/A) - Cervical Four-Five, Cervical Five-Six,Cervical Six-Seven Anterior cervical decompression/diskectomy/fusion  Patient Location: PACU  Anesthesia Type: General   Level of Consciousness: awake, alert , oriented and patient cooperative  Airway and Oxygen Therapy: Patient Spontanous Breathing and Patient connected to nasal cannula oxygen  Post-op Pain: mild  Post-op Assessment: Post-op Vital signs reviewed, Patient's Cardiovascular Status Stable, Respiratory Function Stable, Patent Airway, No signs of Nausea or vomiting and Pain level controlled  Post-op Vital Signs: Reviewed and stable  Complications: No apparent anesthesia complications

## 2012-02-29 NOTE — Op Note (Signed)
Preoperative diagnosis: Cervical spondylosis with radiculopathy C4-5 C5-6 C6-C7 and myelopathy Post operative diagnosis: Cervical spondylosis with radiculopathy C4-5 C5-6 C6-C7 and myelopathy Procedure: Anterior cervical discectomy decompression of nerve roots and spinal canal C4-5 C5-6 C6-C7 arthrodesis with structural allograft, Alphatec plate fixation [id number] Surgeon: Barnett Abu M.D. Asst.: Daryl Eastern Indications: Patient is a 46 year old individual is had significant neck shoulder and right arm pain has evidence of advanced spondylitic disease with a disc herniation at C4-5 spondylitic degenerative changes at C5-6 but by foraminal stenosis and significant foraminal stenosis and cord compression at C6-C7 is been advised regarding the need for surgery Procedure: The patient was brought to the operating room placed on the table in supine position. After the smooth induction of general endotracheal anesthesia neck was placed in 5 pounds of halter traction and prepped with alcohol and DuraPrep. After sterile draping and appropriate timeout procedure a transverse incision was created in the left side of the neck and carried down to the platysma. The plane between the sternocleidomastoid and strap muscles dissected bluntly until the prevertebral space was reached. The first identifiable disc space was noted to be C3-4 on a localizing radiograph. The dissection was then undertaken in the longus coli muscle to allow placement of a self-retaining Caspar type retractor.  The anterior longitudinal ligament was opened at C4-5 and ventral osteophytes were removed with a Leksell rongeur and Kerrison punch. Interspace was cleared of significant quantity of the degenerated disc material ~region of the posterior longitudinal ligament was reached. Dissection was carried out using a high-speed drill and 3-0 Karlin curettes. Uncinate processes were drilled down and removed and osteophytes from the inferior margin of the body  of C4 were removed with a Kerrison 2 mm gold punch. After the central canal and lateral recesses were well decompressed the stasis was achieved with the bipolar cautery and some small pledgets of Gelfoam soaked in thrombin that were later irrigated away.  A 6 mm transgressed was then prepared by enlarging the central cavity and filling with demineralized bone matrix and placing into the interspace. C5-6 Was decompressed and fused in a similar fashion. C6-C7 Was then decompressed and also fused in a similar fashion.  Next the retractor was removed and a 48 mm trestle plate was placed over the vertebral bodies and secured with 14 mm variable angle screws. A final localizing radiograph identified the position of the surgical construct. The stasis was achieved in the soft tissues and then the platysma was closed with 3-0 Vicryl in an interrupted fashion and 3-0 Vicryl was used in the subcuticular tissue. Blood loss was estimated at 75 cc

## 2012-02-29 NOTE — Plan of Care (Signed)
Problem: Consults Goal: Diagnosis - Spinal Surgery Outcome: Completed/Met Date Met:  02/29/12 Cervical Spine Fusion     

## 2012-02-29 NOTE — Discharge Summary (Signed)
Physician Discharge Summary  Patient ID: Darrell Ramirez MRN: [id number] DOB/AGE: 01-11-1966 46 y.o.  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 *    Discharged Condition: good  Hospital Course: Patient was admitted to undergo three-level anterior decompression arthrodesis from C4-C7. He tolerated procedure well.  Consults: None  Significant Diagnostic Studies: MRI cervical spine  Treatments: surgery: Anterior cervical decompression arthrodesis with structural allograft C4-5 C5-6 C6-C7  Discharge Exam: Blood pressure 116/78, pulse 78, temperature 98.7 F (37.1 C), temperature source Oral, resp. rate 18, height 6' (1.829 m), weight 87.8 kg (193 lb 9 oz), SpO2 95.00%. Incision clean and dry motor function intact and deltoids biceps triceps grips and intrinsics  Disposition: 01-Home or Self Care  Discharge Orders    Future Orders Please Complete By Expires   Diet - low sodium heart healthy      Increase activity slowly      Discharge instructions      Comments:   Okay to shower. Do not apply salves or appointments to incision. No heavy lifting with the upper extremities greater than 15 pounds. May resume driving when not requiring pain medication and patient feels comfortable with doing so.   Call MD for:  redness, tenderness, or signs of infection (pain, swelling, redness, odor or green/yellow discharge around incision site)      Call MD for:  severe uncontrolled pain      Call MD for:  temperature >100.4          Medication List     As of 02/29/2012  3:38 PM    TAKE these medications         diazepam 5 MG tablet   Commonly known as: VALIUM   Take 1 tablet (5 mg total) by mouth every 6 (six) hours as needed.     oxyCODONE 5 MG immediate release tablet   Commonly known as: Oxy IR/ROXICODONE   Take 10 mg by mouth every 6 (six) hours as needed. For severe pain      oxyCODONE-acetaminophen 10-325 MG per tablet   Commonly known as: PERCOCET   Take 1 tablet by mouth every 4 (four) hours as needed for pain.         SignedStefani Dama 02/29/2012, 3:38 PM

## 2012-03-03 ENCOUNTER — Encounter (HOSPITAL_COMMUNITY): Payer: Self-pay | Admitting: Neurological Surgery

## 2012-05-10 ENCOUNTER — Other Ambulatory Visit: Payer: Self-pay | Admitting: Neurological Surgery

## 2012-05-23 ENCOUNTER — Encounter (HOSPITAL_BASED_OUTPATIENT_CLINIC_OR_DEPARTMENT_OTHER): Payer: Self-pay | Admitting: *Deleted

## 2012-05-23 ENCOUNTER — Emergency Department (HOSPITAL_BASED_OUTPATIENT_CLINIC_OR_DEPARTMENT_OTHER)
Admission: EM | Admit: 2012-05-23 | Discharge: 2012-05-23 | Disposition: A | Discharge status: Home / Self Care | Payer: Medicaid Other | Attending: Emergency Medicine | Admitting: Emergency Medicine

## 2012-05-23 DIAGNOSIS — Z8719 Personal history of other diseases of the digestive system: Secondary | ICD-10-CM | POA: Insufficient documentation

## 2012-05-23 DIAGNOSIS — R000 Tachycardia, unspecified: Secondary | ICD-10-CM | POA: Insufficient documentation

## 2012-05-23 DIAGNOSIS — K529 Noninfective gastroenteritis and colitis, unspecified: Secondary | ICD-10-CM

## 2012-05-23 DIAGNOSIS — F17200 Nicotine dependence, unspecified, uncomplicated: Secondary | ICD-10-CM | POA: Insufficient documentation

## 2012-05-23 DIAGNOSIS — Z8739 Personal history of other diseases of the musculoskeletal system and connective tissue: Secondary | ICD-10-CM | POA: Insufficient documentation

## 2012-05-23 DIAGNOSIS — K5289 Other specified noninfective gastroenteritis and colitis: Secondary | ICD-10-CM | POA: Insufficient documentation

## 2012-05-23 DIAGNOSIS — R197 Diarrhea, unspecified: Secondary | ICD-10-CM | POA: Insufficient documentation

## 2012-05-23 DIAGNOSIS — Z79899 Other long term (current) drug therapy: Secondary | ICD-10-CM | POA: Insufficient documentation

## 2012-05-23 LAB — CBC
MCV: 84.8 fL (ref 78.0–100.0)
Platelets: 408 10*3/uL — ABNORMAL HIGH (ref 150–400)
RBC: 5.06 MIL/uL (ref 4.22–5.81)
RDW: 12.9 % (ref 11.5–15.5)
WBC: 14 10*3/uL — ABNORMAL HIGH (ref 4.0–10.5)

## 2012-05-23 LAB — BASIC METABOLIC PANEL
CO2: 20 mEq/L (ref 19–32)
Calcium: 9.2 mg/dL (ref 8.4–10.5)
Chloride: 100 mEq/L (ref 96–112)
Creatinine, Ser: 0.8 mg/dL (ref 0.50–1.35)
GFR calc Af Amer: >90 mL/min (ref >90)
Sodium: 134 mEq/L — ABNORMAL LOW (ref 135–145)

## 2012-05-23 MED ORDER — DIPHENOXYLATE-ATROPINE 2.5-0.025 MG PO TABS: 1.0000 | ORAL_TABLET | Freq: Four times a day (QID) | ORAL | Status: DC | PRN

## 2012-05-23 MED ORDER — ONDANSETRON HCL 4 MG/2ML IJ SOLN
4.0000 mg | Freq: Once | INTRAMUSCULAR | Status: AC
  Administered 2012-05-23: 4 mg via INTRAVENOUS
  Filled 2012-05-23: qty 2

## 2012-05-23 MED ORDER — SODIUM CHLORIDE 0.9 % IV BOLUS (SEPSIS)
1000.0000 mL | Freq: Once | INTRAVENOUS | Status: AC
  Administered 2012-05-23: 1000 mL via INTRAVENOUS

## 2012-05-23 MED ORDER — ONDANSETRON HCL 4 MG PO TABS: 4.0000 mg | ORAL_TABLET | Freq: Four times a day (QID) | ORAL | Status: DC

## 2012-05-23 MED ORDER — OXYCODONE HCL 5 MG PO TABS: 10.0000 mg | ORAL_TABLET | ORAL | Status: DC | PRN

## 2012-05-23 MED ORDER — HYDROMORPHONE HCL PF 1 MG/ML IJ SOLN
1.0000 mg | Freq: Once | INTRAMUSCULAR | Status: AC
  Administered 2012-05-23: 1 mg via INTRAVENOUS
  Filled 2012-05-23: qty 1

## 2012-05-23 NOTE — ED Notes (Signed)
Pt. treated for influenza on 05/13/11.

## 2012-05-23 NOTE — ED Notes (Signed)
Vomiting and diarrhea since 5am. Back and neck pain. States he had a hx of chronic neck and back pain. Has not been able to keep his pain medication down due to vomiting.

## 2012-05-24 NOTE — ED Provider Notes (Signed)
Medical screening examination/treatment/procedure(s) were performed by non-physician practitioner and as supervising physician I was immediately available for consultation/collaboration.   Charles B. Bernette Mayers, MD 05/24/12 [id number]

## 2012-05-24 NOTE — ED Provider Notes (Signed)
History     CSN: [id number]  Arrival date & time 05/23/12  1410   First MD Initiated Contact with Patient 05/23/12 1425      Chief Complaint  Patient presents with  . Emesis    (Consider location/radiation/quality/duration/timing/severity/associated sxs/prior treatment) Patient is a 47 y.o. male presenting with vomiting. The history is provided by the patient. No language interpreter was used.  Emesis  This is a new problem. The current episode started 6 to 12 hours ago. The problem occurs 5 to 10 times per day. The problem has been gradually worsening. The emesis has an appearance of stomach contents. There has been no fever. Associated symptoms include diarrhea. Pertinent negatives include no abdominal pain, no chills, no cough and no fever.  N/v/d x 9 hours.  Recent anterior cervical lamenectomy and can not keep pain meds down  Denies weakness to upper extremities.  Stopped taking his anti inflamatory 2 days ago.  Has appointment with Dr. Danielle Dess on Thursday this week.    Past Medical History  Diagnosis Date  . GERD (gastroesophageal reflux disease) otc mrdication  . Sleep apnea     has not been tested.    . Constipation   . Arthritis     Past Surgical History  Procedure Date  . Arthroscopic knee     times 2 right and left  . Back surgery x 3  . Hand surgery     nerve repair from laceration  . Anterior cervical decomp/discectomy fusion 02/29/2012    Procedure: ANTERIOR CERVICAL DECOMPRESSION/DISCECTOMY FUSION 3 LEVELS;  Surgeon: Barnett Abu, MD;  Location: [facility];  Service: Neurosurgery;  Laterality: N/A;  Cervical Four-Five, Cervical Five-Six,Cervical Six-Seven Anterior cervical decompression/diskectomy/fusion    No family history on file.  History  Substance Use Topics  . Smoking status: Current Every Day Smoker -- 0.5 packs/day for 20 years    Types: Cigarettes  . Smokeless tobacco: Not on file  . Alcohol Use: 1.2 oz/week    2 Cans of beer per week   Comment: occasionally      Review of Systems  Constitutional: Negative.  Negative for fever and chills.  HENT: Positive for neck pain.   Eyes: Negative.   Respiratory: Negative.  Negative for cough.   Cardiovascular: Negative.   Gastrointestinal: Positive for vomiting and diarrhea. Negative for abdominal pain.  Neurological: Negative.   Psychiatric/Behavioral: Negative.   All other systems reviewed and are negative.    Allergies  Vicodin and Tylenol pm extra  Home Medications   Current Outpatient Rx  Name  Route  Sig  Dispense  Refill  . DIAZEPAM 5 MG PO TABS   Oral   Take 1 tablet (5 mg total) by mouth every 6 (six) hours as needed.   40 tablet   0   . DIPHENOXYLATE-ATROPINE 2.5-0.025 MG PO TABS   Oral   Take 1 tablet by mouth 4 (four) times daily as needed for diarrhea or loose stools.   10 tablet   0   . ONDANSETRON HCL 4 MG PO TABS   Oral   Take 1 tablet (4 mg total) by mouth every 6 (six) hours.   12 tablet   0   . OXYCODONE HCL 5 MG PO TABS   Oral   Take 10 mg by mouth every 6 (six) hours as needed. For severe pain         . OXYCODONE HCL 5 MG PO TABS   Oral   Take 2 tablets (10  mg total) by mouth every 4 (four) hours as needed for pain.   18 tablet   0   . OXYCODONE-ACETAMINOPHEN 10-325 MG PO TABS   Oral   Take 1 tablet by mouth every 4 (four) hours as needed for pain.   60 tablet   0     BP 108/85  Pulse 94  Temp 98.6 F (37 C) (Oral)  Resp 18  SpO2 98%  Physical Exam  Nursing note and vitals reviewed. Constitutional: He is oriented to person, place, and time. He appears well-developed and well-nourished.  HENT:  Head: Normocephalic.  Eyes: Conjunctivae normal and EOM are normal. Pupils are equal, round, and reactive to light.  Neck: Normal range of motion. Neck supple.  Cardiovascular: Intact distal pulses.  Tachycardia present.   Pulmonary/Chest: Effort normal.  Abdominal: Soft.  Musculoskeletal: Normal range of motion.    Neurological: He is alert and oriented to person, place, and time.  Skin: Skin is warm and dry.  Psychiatric: He has a normal mood and affect.    ED Course  Procedures (including critical care time)  Labs Reviewed  CBC - Abnormal; Notable for the following:    WBC 14.0 (*)     Platelets 408 (*)     All other components within normal limits  BASIC METABOLIC PANEL - Abnormal; Notable for the following:    Sodium 134 (*)     Glucose, Bld 126 (*)     All other components within normal limits   No results found.   1. Gastroenteritis       MDM  Gastroenteritis with tachycardia.  NS bolus and IV pain meds/zofran in the ER with relief.  Rx for zofran.  He will continue taking the oxycodone rx for the same .  Out of pain meds at home. Goes for recheck this Thursday with Dr. Danielle Dess.  He understands to return to ER for worsening symptoms including fever or weakness to upper extremities.  HR 94 after bolus. Ready for discharge. Labs Reviewed  CBC - Abnormal; Notable for the following:    WBC 14.0 (*)     Platelets 408 (*)     All other components within normal limits  BASIC METABOLIC PANEL - Abnormal; Notable for the following:    Sodium 134 (*)     Glucose, Bld 126 (*)     All other components within normal limits          Remi Haggard, NP 05/24/12 2000

## 2012-05-31 ENCOUNTER — Other Ambulatory Visit: Payer: Self-pay | Admitting: Neurological Surgery

## 2012-05-31 DIAGNOSIS — M47812 Spondylosis without myelopathy or radiculopathy, cervical region: Secondary | ICD-10-CM

## 2012-06-07 ENCOUNTER — Ambulatory Visit
Admission: RE | Admit: 2012-06-07 | Discharge: 2012-06-07 | Disposition: A | Discharge status: Home / Self Care | Payer: Medicaid Other | Source: Ambulatory Visit | Attending: Neurological Surgery | Admitting: Neurological Surgery

## 2012-06-07 DIAGNOSIS — M47812 Spondylosis without myelopathy or radiculopathy, cervical region: Secondary | ICD-10-CM

## 2012-09-16 ENCOUNTER — Other Ambulatory Visit (HOSPITAL_COMMUNITY): Payer: Self-pay | Admitting: Neurological Surgery

## 2012-09-16 ENCOUNTER — Other Ambulatory Visit: Payer: Self-pay | Admitting: Neurological Surgery

## 2012-09-16 DIAGNOSIS — M545 Low back pain, unspecified: Secondary | ICD-10-CM

## 2012-09-16 DIAGNOSIS — M5412 Radiculopathy, cervical region: Secondary | ICD-10-CM

## 2012-09-16 DIAGNOSIS — M546 Pain in thoracic spine: Secondary | ICD-10-CM

## 2012-09-19 ENCOUNTER — Encounter (HOSPITAL_COMMUNITY): Payer: Self-pay | Admitting: Pharmacy Technician

## 2012-09-22 ENCOUNTER — Encounter (HOSPITAL_COMMUNITY): Payer: Self-pay

## 2012-09-22 ENCOUNTER — Ambulatory Visit (HOSPITAL_COMMUNITY)
Admission: RE | Admit: 2012-09-22 | Discharge: 2012-09-22 | Disposition: A | Discharge status: Home / Self Care | Payer: Medicaid Other | Source: Ambulatory Visit | Attending: Neurological Surgery | Admitting: Neurological Surgery

## 2012-09-22 DIAGNOSIS — M545 Low back pain, unspecified: Secondary | ICD-10-CM

## 2012-09-22 DIAGNOSIS — M542 Cervicalgia: Secondary | ICD-10-CM | POA: Insufficient documentation

## 2012-09-22 DIAGNOSIS — M546 Pain in thoracic spine: Secondary | ICD-10-CM

## 2012-09-22 DIAGNOSIS — M5412 Radiculopathy, cervical region: Secondary | ICD-10-CM

## 2012-09-22 DIAGNOSIS — M48061 Spinal stenosis, lumbar region without neurogenic claudication: Secondary | ICD-10-CM | POA: Insufficient documentation

## 2012-09-22 MED ORDER — OXYCODONE HCL 5 MG PO TABS: 5.0000 mg | ORAL_TABLET | ORAL | Status: DC | PRN

## 2012-09-22 MED ORDER — ONDANSETRON HCL 4 MG/2ML IJ SOLN: 4.0000 mg | Freq: Four times a day (QID) | INTRAMUSCULAR | Status: DC | PRN

## 2012-09-22 MED ORDER — OXYCODONE HCL ER 10 MG PO T12A: 10.0000 mg | EXTENDED_RELEASE_TABLET | Freq: Two times a day (BID) | ORAL | Status: DC

## 2012-09-22 MED ORDER — DIAZEPAM 5 MG PO TABS
ORAL_TABLET | ORAL | Status: AC
  Administered 2012-09-22: 10 mg via ORAL
  Filled 2012-09-22: qty 2

## 2012-09-22 MED ORDER — DIAZEPAM 5 MG PO TABS
10.0000 mg | ORAL_TABLET | Freq: Once | ORAL | Status: AC
  Administered 2012-09-22: 10 mg via ORAL

## 2012-09-22 MED ORDER — IOHEXOL 300 MG/ML  SOLN: 10.0000 mL | Freq: Once | INTRAMUSCULAR | Status: DC | PRN

## 2012-09-22 MED ORDER — OXYCODONE HCL 5 MG PO TABS
5.0000 mg | ORAL_TABLET | ORAL | Status: DC | PRN
  Administered 2012-09-22: 5 mg via ORAL

## 2012-09-22 MED ORDER — OXYCODONE HCL 5 MG PO TABS
ORAL_TABLET | ORAL | Status: AC
  Filled 2012-09-22: qty 1

## 2012-09-22 MED ORDER — OXYCODONE HCL 5 MG PO TABS
ORAL_TABLET | ORAL | Status: AC
  Administered 2012-09-22: 5 mg via ORAL
  Filled 2012-09-22: qty 1

## 2012-09-22 NOTE — Procedures (Addendum)
Roshun Demirjian is a 47 year old right-handed white male who has had previous anterior cervical decompression arthrodesis at C5-6 and C6-C7. He has had a more decompression and fusion at L4 to sacrum he now believes he has a C8 and L3 radiculopathy. MRI 7 nondescript. He's been advised regarding a myelogram and post milligrams CAT scan to see if there is any significant pathology in these regions. He is been requiring substantial use of pain medication which he claims for pain control.  Pre op Dx:: Lumbar radiculopathy, cervical radiculopathy Post op Dx: Same Procedure: Total myelogram Surgeon: Danielle Dess Puncture level: L2-3 Fluid color: Clear colorless Injection: Iohexol 300  9 cc Findings: No nerve root compromise minimal spondylosis status post arthrodesis L4 to sacrum status post arthrodesis C5-C7

## 2013-11-21 DIAGNOSIS — G5600 Carpal tunnel syndrome, unspecified upper limb: Secondary | ICD-10-CM | POA: Diagnosis not present

## 2013-11-21 DIAGNOSIS — IMO0002 Reserved for concepts with insufficient information to code with codable children: Secondary | ICD-10-CM | POA: Diagnosis not present

## 2013-11-21 DIAGNOSIS — M545 Low back pain, unspecified: Secondary | ICD-10-CM | POA: Diagnosis not present

## 2013-11-21 DIAGNOSIS — M542 Cervicalgia: Secondary | ICD-10-CM | POA: Diagnosis not present

## 2014-01-18 DIAGNOSIS — M47817 Spondylosis without myelopathy or radiculopathy, lumbosacral region: Secondary | ICD-10-CM | POA: Diagnosis not present

## 2014-01-18 DIAGNOSIS — M545 Low back pain, unspecified: Secondary | ICD-10-CM | POA: Diagnosis not present

## 2014-01-18 DIAGNOSIS — G5620 Lesion of ulnar nerve, unspecified upper limb: Secondary | ICD-10-CM | POA: Diagnosis not present

## 2014-02-12 DIAGNOSIS — G5602 Carpal tunnel syndrome, left upper limb: Secondary | ICD-10-CM | POA: Diagnosis not present

## 2014-02-12 DIAGNOSIS — M47816 Spondylosis without myelopathy or radiculopathy, lumbar region: Secondary | ICD-10-CM | POA: Diagnosis not present

## 2014-02-12 DIAGNOSIS — M542 Cervicalgia: Secondary | ICD-10-CM | POA: Diagnosis not present

## 2014-02-12 DIAGNOSIS — G5622 Lesion of ulnar nerve, left upper limb: Secondary | ICD-10-CM | POA: Diagnosis not present

## 2014-02-12 DIAGNOSIS — M5412 Radiculopathy, cervical region: Secondary | ICD-10-CM | POA: Diagnosis not present

## 2014-02-12 DIAGNOSIS — S129XXD Fracture of neck, unspecified, subsequent encounter: Secondary | ICD-10-CM | POA: Diagnosis not present

## 2014-02-12 DIAGNOSIS — G5601 Carpal tunnel syndrome, right upper limb: Secondary | ICD-10-CM | POA: Diagnosis not present

## 2014-02-12 DIAGNOSIS — Z6825 Body mass index (BMI) 25.0-25.9, adult: Secondary | ICD-10-CM | POA: Diagnosis not present

## 2014-06-05 DIAGNOSIS — G5601 Carpal tunnel syndrome, right upper limb: Secondary | ICD-10-CM | POA: Diagnosis not present

## 2014-06-05 DIAGNOSIS — M542 Cervicalgia: Secondary | ICD-10-CM | POA: Diagnosis not present

## 2014-06-05 DIAGNOSIS — Z6827 Body mass index (BMI) 27.0-27.9, adult: Secondary | ICD-10-CM | POA: Diagnosis not present

## 2014-06-05 DIAGNOSIS — M47816 Spondylosis without myelopathy or radiculopathy, lumbar region: Secondary | ICD-10-CM | POA: Diagnosis not present

## 2014-06-05 DIAGNOSIS — M5412 Radiculopathy, cervical region: Secondary | ICD-10-CM | POA: Diagnosis not present

## 2014-06-21 DIAGNOSIS — M542 Cervicalgia: Secondary | ICD-10-CM | POA: Diagnosis not present

## 2014-06-21 DIAGNOSIS — G5601 Carpal tunnel syndrome, right upper limb: Secondary | ICD-10-CM | POA: Diagnosis not present

## 2014-07-12 DIAGNOSIS — G5601 Carpal tunnel syndrome, right upper limb: Secondary | ICD-10-CM | POA: Diagnosis not present

## 2014-07-12 DIAGNOSIS — Z6825 Body mass index (BMI) 25.0-25.9, adult: Secondary | ICD-10-CM | POA: Diagnosis not present

## 2014-07-12 DIAGNOSIS — M542 Cervicalgia: Secondary | ICD-10-CM | POA: Diagnosis not present

## 2014-07-12 DIAGNOSIS — M5412 Radiculopathy, cervical region: Secondary | ICD-10-CM | POA: Diagnosis not present

## 2014-07-12 DIAGNOSIS — M47816 Spondylosis without myelopathy or radiculopathy, lumbar region: Secondary | ICD-10-CM | POA: Diagnosis not present

## 2014-10-18 DIAGNOSIS — R41840 Attention and concentration deficit: Secondary | ICD-10-CM | POA: Diagnosis not present

## 2014-10-18 DIAGNOSIS — F419 Anxiety disorder, unspecified: Secondary | ICD-10-CM | POA: Diagnosis not present

## 2014-10-18 DIAGNOSIS — M549 Dorsalgia, unspecified: Secondary | ICD-10-CM | POA: Diagnosis not present

## 2014-10-18 DIAGNOSIS — R5383 Other fatigue: Secondary | ICD-10-CM | POA: Diagnosis not present

## 2014-10-18 DIAGNOSIS — Z0000 Encounter for general adult medical examination without abnormal findings: Secondary | ICD-10-CM | POA: Diagnosis not present

## 2014-10-18 DIAGNOSIS — G8929 Other chronic pain: Secondary | ICD-10-CM | POA: Diagnosis not present

## 2014-10-18 DIAGNOSIS — Z125 Encounter for screening for malignant neoplasm of prostate: Secondary | ICD-10-CM | POA: Diagnosis not present

## 2014-10-18 DIAGNOSIS — E785 Hyperlipidemia, unspecified: Secondary | ICD-10-CM | POA: Diagnosis not present

## 2014-10-18 DIAGNOSIS — F17200 Nicotine dependence, unspecified, uncomplicated: Secondary | ICD-10-CM | POA: Diagnosis not present

## 2014-10-24 DIAGNOSIS — G5601 Carpal tunnel syndrome, right upper limb: Secondary | ICD-10-CM | POA: Diagnosis not present

## 2014-10-24 DIAGNOSIS — M47816 Spondylosis without myelopathy or radiculopathy, lumbar region: Secondary | ICD-10-CM | POA: Diagnosis not present

## 2014-10-24 DIAGNOSIS — M542 Cervicalgia: Secondary | ICD-10-CM | POA: Diagnosis not present

## 2014-10-24 DIAGNOSIS — M5412 Radiculopathy, cervical region: Secondary | ICD-10-CM | POA: Diagnosis not present

## 2015-02-05 DIAGNOSIS — M542 Cervicalgia: Secondary | ICD-10-CM | POA: Diagnosis not present

## 2015-02-05 DIAGNOSIS — G5601 Carpal tunnel syndrome, right upper limb: Secondary | ICD-10-CM | POA: Diagnosis not present

## 2015-02-05 DIAGNOSIS — M5412 Radiculopathy, cervical region: Secondary | ICD-10-CM | POA: Diagnosis not present

## 2015-02-05 DIAGNOSIS — M47816 Spondylosis without myelopathy or radiculopathy, lumbar region: Secondary | ICD-10-CM | POA: Diagnosis not present

## 2015-03-19 ENCOUNTER — Emergency Department (HOSPITAL_BASED_OUTPATIENT_CLINIC_OR_DEPARTMENT_OTHER): Payer: Medicare Other

## 2015-03-19 ENCOUNTER — Encounter (HOSPITAL_BASED_OUTPATIENT_CLINIC_OR_DEPARTMENT_OTHER): Payer: Self-pay | Admitting: Emergency Medicine

## 2015-03-19 ENCOUNTER — Emergency Department (HOSPITAL_BASED_OUTPATIENT_CLINIC_OR_DEPARTMENT_OTHER)
Admission: EM | Admit: 2015-03-19 | Discharge: 2015-03-20 | Disposition: A | Discharge status: Home / Self Care | Payer: Medicare Other | Attending: Emergency Medicine | Admitting: Emergency Medicine

## 2015-03-19 DIAGNOSIS — M542 Cervicalgia: Secondary | ICD-10-CM | POA: Diagnosis not present

## 2015-03-19 DIAGNOSIS — R42 Dizziness and giddiness: Secondary | ICD-10-CM | POA: Diagnosis not present

## 2015-03-19 DIAGNOSIS — Y998 Other external cause status: Secondary | ICD-10-CM | POA: Diagnosis not present

## 2015-03-19 DIAGNOSIS — S060X9A Concussion with loss of consciousness of unspecified duration, initial encounter: Secondary | ICD-10-CM | POA: Diagnosis not present

## 2015-03-19 DIAGNOSIS — Y9389 Activity, other specified: Secondary | ICD-10-CM | POA: Insufficient documentation

## 2015-03-19 DIAGNOSIS — Z8669 Personal history of other diseases of the nervous system and sense organs: Secondary | ICD-10-CM | POA: Insufficient documentation

## 2015-03-19 DIAGNOSIS — M545 Low back pain: Secondary | ICD-10-CM | POA: Diagnosis not present

## 2015-03-19 DIAGNOSIS — W19XXXA Unspecified fall, initial encounter: Secondary | ICD-10-CM

## 2015-03-19 DIAGNOSIS — M1990 Unspecified osteoarthritis, unspecified site: Secondary | ICD-10-CM | POA: Insufficient documentation

## 2015-03-19 DIAGNOSIS — Z791 Long term (current) use of non-steroidal anti-inflammatories (NSAID): Secondary | ICD-10-CM | POA: Insufficient documentation

## 2015-03-19 DIAGNOSIS — Z8719 Personal history of other diseases of the digestive system: Secondary | ICD-10-CM | POA: Insufficient documentation

## 2015-03-19 DIAGNOSIS — G8929 Other chronic pain: Secondary | ICD-10-CM | POA: Insufficient documentation

## 2015-03-19 DIAGNOSIS — M4322 Fusion of spine, cervical region: Secondary | ICD-10-CM | POA: Diagnosis not present

## 2015-03-19 DIAGNOSIS — G44319 Acute post-traumatic headache, not intractable: Secondary | ICD-10-CM | POA: Diagnosis not present

## 2015-03-19 DIAGNOSIS — Y9289 Other specified places as the place of occurrence of the external cause: Secondary | ICD-10-CM | POA: Insufficient documentation

## 2015-03-19 DIAGNOSIS — R51 Headache: Secondary | ICD-10-CM | POA: Diagnosis not present

## 2015-03-19 DIAGNOSIS — F17210 Nicotine dependence, cigarettes, uncomplicated: Secondary | ICD-10-CM | POA: Diagnosis not present

## 2015-03-19 DIAGNOSIS — S3992XA Unspecified injury of lower back, initial encounter: Secondary | ICD-10-CM | POA: Insufficient documentation

## 2015-03-19 DIAGNOSIS — M546 Pain in thoracic spine: Secondary | ICD-10-CM | POA: Diagnosis not present

## 2015-03-19 DIAGNOSIS — W01198A Fall on same level from slipping, tripping and stumbling with subsequent striking against other object, initial encounter: Secondary | ICD-10-CM | POA: Insufficient documentation

## 2015-03-19 DIAGNOSIS — S29002A Unspecified injury of muscle and tendon of back wall of thorax, initial encounter: Secondary | ICD-10-CM | POA: Diagnosis not present

## 2015-03-19 DIAGNOSIS — S199XXA Unspecified injury of neck, initial encounter: Secondary | ICD-10-CM | POA: Diagnosis not present

## 2015-03-19 DIAGNOSIS — S0990XA Unspecified injury of head, initial encounter: Secondary | ICD-10-CM | POA: Diagnosis present

## 2015-03-19 MED ORDER — PROCHLORPERAZINE MALEATE 10 MG PO TABS
10.0000 mg | ORAL_TABLET | Freq: Once | ORAL | Status: AC
  Administered 2015-03-19: 10 mg via ORAL
  Filled 2015-03-19: qty 1

## 2015-03-19 MED ORDER — PROCHLORPERAZINE EDISYLATE 5 MG/ML IJ SOLN: 10.0000 mg | Freq: Four times a day (QID) | INTRAMUSCULAR | Status: DC | PRN

## 2015-03-19 NOTE — ED Notes (Signed)
Pt states fell Sunday pm hit back of head  Per pt loc,  Pt a&o,  Ambulatory,  Pupils 3  brisk

## 2015-03-19 NOTE — ED Notes (Signed)
Patient states that he fell Sunday and hit hte back of his head. Reports that he possibly had LOC but is unsure. Patient reports a HA at this time

## 2015-03-19 NOTE — ED Provider Notes (Signed)
CSN: [id number]     Arrival date & time 03/19/15  2110 History     Chief Complaint  Patient presents with  . Head Injury   HPI Comments: Sunday fell backwards, hit head on concrete. Stood up too quickly and then fell backwards. +LOC Not sure how long of LOC Degenerative disc disease history 10/10 HA since fall, constant since then Gradually been getting worse Feels like something is wrong   Patient is a 49 y.o. male presenting with head injury. The history is provided by the patient. No language interpreter was used.  Head Injury Associated symptoms: headache and neck pain   Associated symptoms: no nausea, no numbness (intermittent in right arm when held in certain position) and no vomiting        Past Medical History  Diagnosis Date  . GERD (gastroesophageal reflux disease) otc mrdication  . Sleep apnea     has not been tested.    . Constipation   . Arthritis    Past Surgical History  Procedure Laterality Date  . Arthroscopic knee      times 2 right and left  . Back surgery  x 3  . Hand surgery      nerve repair from laceration  . Anterior cervical decomp/discectomy fusion  02/29/2012    Procedure: ANTERIOR CERVICAL DECOMPRESSION/DISCECTOMY FUSION 3 LEVELS;  Surgeon: Barnett AbuHenry Elsner, MD;  Location: [facility];  Service: Neurosurgery;  Laterality: N/A;  Cervical Four-Five, Cervical Five-Six,Cervical Six-Seven Anterior cervical decompression/diskectomy/fusion   History reviewed. No pertinent family history. Social History  Substance Use Topics  . Smoking status: Current Every Day Smoker -- 0.50 packs/day for 20 years    Types: Cigarettes  . Smokeless tobacco: None  . Alcohol Use: 1.2 oz/week    2 Cans of beer per week     Comment: occasionally    Review of Systems  Constitutional: Negative for fever.  HENT: Negative for sore throat.   Eyes: Negative for visual disturbance.  Respiratory: Negative for shortness of breath.   Cardiovascular: Negative for chest  pain.  Gastrointestinal: Negative for nausea, vomiting, abdominal pain and diarrhea.  Genitourinary: Negative for difficulty urinating.  Musculoskeletal: Positive for back pain and neck pain. Negative for neck stiffness.  Skin: Negative for rash.  Neurological: Positive for headaches. Negative for syncope, weakness, light-headedness and numbness (intermittent in right arm when held in certain position).      Allergies  Vicodin and Tylenol pm extra  Home Medications   Prior to Admission medications   Medication Sig Start Date End Date Taking? Authorizing Provider  indomethacin (INDOCIN) 50 MG capsule Take 50 mg by mouth 2 (two) times daily with a meal.    Historical Provider, MD  Menthol, Topical Analgesic, (ICY HOT EX) Apply 1 application topically daily as needed. For muscle soreness    Historical Provider, MD  oxyCODONE (OXYCONTIN) 10 MG 12 hr tablet Take 10 mg by mouth every 6 (six) hours as needed for pain.    Historical Provider, MD   BP 109/71 mmHg  Pulse 70  Temp(Src) 98.5 F (36.9 C) (Oral)  Resp 18  Ht 6' (1.829 m)  Wt 170 lb (77.111 kg)  BMI 23.05 kg/m2  SpO2 99% Physical Exam  Constitutional: He is oriented to person, place, and time. He appears well-developed and well-nourished. No distress.  HENT:  Head: Normocephalic and atraumatic.  Eyes: Conjunctivae and EOM are normal.  Neck: Normal range of motion. Neck supple. No tracheal deviation present.  Cardiovascular: Normal rate,  regular rhythm, normal heart sounds and intact distal pulses.  Exam reveals no gallop and no friction rub.   No murmur heard. Pulmonary/Chest: Effort normal and breath sounds normal. No respiratory distress. He has no wheezes. He has no rales.  Abdominal: Soft. He exhibits no distension. There is no tenderness. There is no guarding.  Musculoskeletal: He exhibits no edema.       Cervical back: He exhibits tenderness and bony tenderness.       Thoracic back: He exhibits tenderness.        Lumbar back: He exhibits tenderness.  Neurological: He is alert and oriented to person, place, and time.  Skin: Skin is warm and dry. He is not diaphoretic.  Psychiatric: He has a normal mood and affect. His behavior is normal.  Nursing note and vitals reviewed.   ED Course  Procedures  DIAGNOSTIC STUDIES: Oxygen Saturation is 100% on RA, normal by my interpretation.    COORDINATION OF CARE: 2:03 AM Discussed treatment plan with pt at bedside and pt agreed to plan.   Labs Review Labs Reviewed - No data to display  Imaging Review Dg Thoracic Spine 2 View  03/20/2015  CLINICAL DATA:  Fall last night, diffuse thoracic and lumbar spine pain. EXAM: THORACIC SPINE 2 VIEWS ; LUMBAR SPINE - COMPLETE 4+ VIEW COMPARISON:  None. FINDINGS: There is no evidence of thoracic spine fracture. Alignment is normal. No other significant bone abnormalities are identified. Partially imaged cervical ACDF. Lumbar vertebral bodies are intact aligned with maintenance of lumbar lordosis. Status post L4-5 and L5-S1 PLIF with incorporated interbody graft material, intact well-seated pedicle screws without periprosthetic lucency. Non surgically altered disc heights preserved. No visualized pars interarticularis defects though lower lumbar vertebral bodies obscured by hardware. Sacroiliac joints are symmetric. Included prevertebral and paraspinal soft tissue planes are normal. IMPRESSION: No acute thoracic spine fracture deformity or malalignment. No acute lumbar spine fracture deformity or malalignment. Status post L4-5 and L5-S1 PLIF, solid bony fusion. Electronically Signed   By: Awilda Metro M.D.   On: 03/20/2015 00:53   Dg Lumbar Spine Complete  03/20/2015  CLINICAL DATA:  Fall last night, diffuse thoracic and lumbar spine pain. EXAM: THORACIC SPINE 2 VIEWS ; LUMBAR SPINE - COMPLETE 4+ VIEW COMPARISON:  None. FINDINGS: There is no evidence of thoracic spine fracture. Alignment is normal. No other significant  bone abnormalities are identified. Partially imaged cervical ACDF. Lumbar vertebral bodies are intact aligned with maintenance of lumbar lordosis. Status post L4-5 and L5-S1 PLIF with incorporated interbody graft material, intact well-seated pedicle screws without periprosthetic lucency. Non surgically altered disc heights preserved. No visualized pars interarticularis defects though lower lumbar vertebral bodies obscured by hardware. Sacroiliac joints are symmetric. Included prevertebral and paraspinal soft tissue planes are normal. IMPRESSION: No acute thoracic spine fracture deformity or malalignment. No acute lumbar spine fracture deformity or malalignment. Status post L4-5 and L5-S1 PLIF, solid bony fusion. Electronically Signed   By: Awilda Metro M.D.   On: 03/20/2015 00:53   Ct Head Wo Contrast  03/19/2015  CLINICAL DATA:  Larey Seat 2 days ago, occipital headache, dizziness EXAM: CT HEAD WITHOUT CONTRAST TECHNIQUE: Contiguous axial images were obtained from the base of the skull through the vertex without intravenous contrast. COMPARISON:  09/22/2012 FINDINGS: Mild atrophy. There is no evidence of acute intracranial hemorrhage, brain edema, mass lesion, acute infarction, mass effect, or midline shift. Acute infarct may be inapparent on noncontrast CT. No other intra-axial abnormalities are seen, and the ventricles and sulci  are within normal limits in size and symmetry. No abnormal extra-axial fluid collections or masses are identified. No significant calvarial abnormality. IMPRESSION: 1. Negative for bleed or other acute intracranial process. Electronically Signed   By: Corlis Leak M.D.   On: 03/19/2015 23:02   Ct Cervical Spine Wo Contrast  03/20/2015  CLINICAL DATA:  Fall striking head 2 days prior with questionable loss of consciousness. Occipital headache since that time. Chronic neck pain, post cervical fusion EXAM: CT CERVICAL SPINE WITHOUT CONTRAST TECHNIQUE: Multidetector CT imaging of the  cervical spine was performed without intravenous contrast. Multiplanar CT image reconstructions were also generated. COMPARISON:  Cervical spine CT 09/22/2012 FINDINGS: Anterior cervical fusion C4 through C7 with intact hardware. Interbody spacers in place. There is generalized decrease in disc space lucency seen on previous, with mild persistent at C5-C6. No change in alignment from prior exam. No acute fracture. Scattered facet arthropathy and neural foraminal stenosis is unchanged from previous. The dens is intact. C1 intact. No jumped or perched facets. No prevertebral soft tissue edema. IMPRESSION: 1. Anterior C4 through C7 fusion with intact hardware. No acute hardware complication. 2. No acute fracture or subluxation. Electronically Signed   By: Rubye Oaks M.D.   On: 03/20/2015 01:18   I have personally reviewed and evaluated these images and lab results as part of my medical decision-making.   EKG Interpretation None      MDM   Final diagnoses:  Fall, initial encounter  Acute post-traumatic headache, not intractable  Cervical pain   50 year old male with a history of chronic back pain, cervical fusion, resents with concern of headache after a fall backwards on Sunday hitting his head with loss of consciousness. CT head shows no sign of acute abnormality. Patient is neurologically intact, however reports midline neck tenderness and CT cervical spine was ordered which showed no acute abnormalities. XR lumbar and thoracic spine show no abnormalities.  Patient had reported he had only been taking indomethacin for his pain, however on review of the ends and see controlled substance database, patient refilled a prescription for oxycodone on November 11.  Discussed pt may have concussion vs other post-traumatic HA and recommended PCP follow up. Given compazine for HA.  OF note, patient also concerned regarding polyarthralgias.  He reports pain in his right knee, bilateral hands with associated  swelling. On exam he has no signs of erythema, no significant swelling, low suspicion for septic arthritis. Discussed that there is concerns for polyarthritis, patient should follow-up with his primary care physician for further evaluation.   Alvira Monday, MD 03/20/15 [phone]

## 2015-03-20 ENCOUNTER — Emergency Department (HOSPITAL_BASED_OUTPATIENT_CLINIC_OR_DEPARTMENT_OTHER): Payer: Medicare Other

## 2015-03-20 DIAGNOSIS — M546 Pain in thoracic spine: Secondary | ICD-10-CM | POA: Diagnosis not present

## 2015-03-20 DIAGNOSIS — M545 Low back pain: Secondary | ICD-10-CM | POA: Diagnosis not present

## 2015-03-20 DIAGNOSIS — S060X9A Concussion with loss of consciousness of unspecified duration, initial encounter: Secondary | ICD-10-CM | POA: Diagnosis not present

## 2015-03-20 DIAGNOSIS — M4322 Fusion of spine, cervical region: Secondary | ICD-10-CM | POA: Diagnosis not present

## 2015-04-12 DIAGNOSIS — R062 Wheezing: Secondary | ICD-10-CM | POA: Diagnosis not present

## 2015-04-12 DIAGNOSIS — Z630 Problems in relationship with spouse or partner: Secondary | ICD-10-CM | POA: Diagnosis not present

## 2015-04-12 DIAGNOSIS — J40 Bronchitis, not specified as acute or chronic: Secondary | ICD-10-CM | POA: Diagnosis not present

## 2015-04-12 DIAGNOSIS — R05 Cough: Secondary | ICD-10-CM | POA: Diagnosis not present

## 2015-05-08 DIAGNOSIS — M47816 Spondylosis without myelopathy or radiculopathy, lumbar region: Secondary | ICD-10-CM | POA: Diagnosis not present

## 2015-05-08 DIAGNOSIS — G5601 Carpal tunnel syndrome, right upper limb: Secondary | ICD-10-CM | POA: Diagnosis not present

## 2015-05-08 DIAGNOSIS — M542 Cervicalgia: Secondary | ICD-10-CM | POA: Diagnosis not present

## 2015-05-08 DIAGNOSIS — M5412 Radiculopathy, cervical region: Secondary | ICD-10-CM | POA: Diagnosis not present

## 2015-07-29 DIAGNOSIS — J3489 Other specified disorders of nose and nasal sinuses: Secondary | ICD-10-CM | POA: Diagnosis not present

## 2015-07-29 DIAGNOSIS — J111 Influenza due to unidentified influenza virus with other respiratory manifestations: Secondary | ICD-10-CM | POA: Diagnosis not present

## 2015-07-29 DIAGNOSIS — R509 Fever, unspecified: Secondary | ICD-10-CM | POA: Diagnosis not present

## 2015-07-29 DIAGNOSIS — F17200 Nicotine dependence, unspecified, uncomplicated: Secondary | ICD-10-CM | POA: Diagnosis not present

## 2015-07-29 DIAGNOSIS — R072 Precordial pain: Secondary | ICD-10-CM | POA: Diagnosis not present

## 2015-07-29 DIAGNOSIS — R05 Cough: Secondary | ICD-10-CM | POA: Diagnosis not present

## 2015-07-29 DIAGNOSIS — J101 Influenza due to other identified influenza virus with other respiratory manifestations: Secondary | ICD-10-CM | POA: Diagnosis not present

## 2015-07-29 DIAGNOSIS — R0789 Other chest pain: Secondary | ICD-10-CM | POA: Diagnosis not present

## 2015-08-14 DIAGNOSIS — M47816 Spondylosis without myelopathy or radiculopathy, lumbar region: Secondary | ICD-10-CM | POA: Diagnosis not present

## 2015-08-14 DIAGNOSIS — M5412 Radiculopathy, cervical region: Secondary | ICD-10-CM | POA: Diagnosis not present

## 2015-08-14 DIAGNOSIS — M542 Cervicalgia: Secondary | ICD-10-CM | POA: Diagnosis not present

## 2015-08-14 DIAGNOSIS — G5601 Carpal tunnel syndrome, right upper limb: Secondary | ICD-10-CM | POA: Diagnosis not present

## 2015-09-09 DIAGNOSIS — M542 Cervicalgia: Secondary | ICD-10-CM | POA: Diagnosis not present

## 2015-09-09 DIAGNOSIS — M47816 Spondylosis without myelopathy or radiculopathy, lumbar region: Secondary | ICD-10-CM | POA: Diagnosis not present

## 2015-09-09 DIAGNOSIS — M5412 Radiculopathy, cervical region: Secondary | ICD-10-CM | POA: Diagnosis not present

## 2015-09-09 DIAGNOSIS — M545 Low back pain: Secondary | ICD-10-CM | POA: Diagnosis not present

## 2015-10-08 DIAGNOSIS — M5412 Radiculopathy, cervical region: Secondary | ICD-10-CM | POA: Diagnosis not present

## 2015-11-07 DIAGNOSIS — M47816 Spondylosis without myelopathy or radiculopathy, lumbar region: Secondary | ICD-10-CM | POA: Diagnosis not present

## 2015-11-07 DIAGNOSIS — M542 Cervicalgia: Secondary | ICD-10-CM | POA: Diagnosis not present

## 2015-11-07 DIAGNOSIS — G5601 Carpal tunnel syndrome, right upper limb: Secondary | ICD-10-CM | POA: Diagnosis not present

## 2015-11-07 DIAGNOSIS — M5412 Radiculopathy, cervical region: Secondary | ICD-10-CM | POA: Diagnosis not present

## 2016-01-09 DIAGNOSIS — M47816 Spondylosis without myelopathy or radiculopathy, lumbar region: Secondary | ICD-10-CM | POA: Diagnosis not present

## 2016-01-09 DIAGNOSIS — M4806 Spinal stenosis, lumbar region: Secondary | ICD-10-CM | POA: Diagnosis not present

## 2016-01-09 DIAGNOSIS — G5601 Carpal tunnel syndrome, right upper limb: Secondary | ICD-10-CM | POA: Diagnosis not present

## 2016-01-23 DIAGNOSIS — Z6825 Body mass index (BMI) 25.0-25.9, adult: Secondary | ICD-10-CM | POA: Diagnosis not present

## 2016-01-23 DIAGNOSIS — M4806 Spinal stenosis, lumbar region: Secondary | ICD-10-CM | POA: Diagnosis not present

## 2016-01-23 DIAGNOSIS — G5601 Carpal tunnel syndrome, right upper limb: Secondary | ICD-10-CM | POA: Diagnosis not present

## 2016-01-23 DIAGNOSIS — M5412 Radiculopathy, cervical region: Secondary | ICD-10-CM | POA: Diagnosis not present

## 2016-03-02 DIAGNOSIS — Z23 Encounter for immunization: Secondary | ICD-10-CM | POA: Diagnosis not present

## 2016-04-02 DIAGNOSIS — M48061 Spinal stenosis, lumbar region without neurogenic claudication: Secondary | ICD-10-CM | POA: Diagnosis not present

## 2016-04-02 DIAGNOSIS — G5601 Carpal tunnel syndrome, right upper limb: Secondary | ICD-10-CM | POA: Diagnosis not present

## 2016-04-02 DIAGNOSIS — M5412 Radiculopathy, cervical region: Secondary | ICD-10-CM | POA: Diagnosis not present

## 2016-05-08 DIAGNOSIS — M542 Cervicalgia: Secondary | ICD-10-CM | POA: Diagnosis not present

## 2016-05-08 DIAGNOSIS — Z6825 Body mass index (BMI) 25.0-25.9, adult: Secondary | ICD-10-CM | POA: Diagnosis not present

## 2016-05-08 DIAGNOSIS — M48061 Spinal stenosis, lumbar region without neurogenic claudication: Secondary | ICD-10-CM | POA: Diagnosis not present

## 2016-05-14 DIAGNOSIS — M5412 Radiculopathy, cervical region: Secondary | ICD-10-CM | POA: Diagnosis not present

## 2016-05-21 DIAGNOSIS — G5603 Carpal tunnel syndrome, bilateral upper limbs: Secondary | ICD-10-CM | POA: Diagnosis not present

## 2016-05-21 DIAGNOSIS — M542 Cervicalgia: Secondary | ICD-10-CM | POA: Diagnosis not present

## 2016-05-21 DIAGNOSIS — M5412 Radiculopathy, cervical region: Secondary | ICD-10-CM | POA: Diagnosis not present

## 2016-05-22 DIAGNOSIS — J111 Influenza due to unidentified influenza virus with other respiratory manifestations: Secondary | ICD-10-CM | POA: Diagnosis not present

## 2016-05-27 DIAGNOSIS — Z716 Tobacco abuse counseling: Secondary | ICD-10-CM | POA: Diagnosis not present

## 2016-05-27 DIAGNOSIS — Z720 Tobacco use: Secondary | ICD-10-CM | POA: Diagnosis not present

## 2016-05-27 DIAGNOSIS — J069 Acute upper respiratory infection, unspecified: Secondary | ICD-10-CM | POA: Diagnosis not present

## 2016-06-22 DIAGNOSIS — G5601 Carpal tunnel syndrome, right upper limb: Secondary | ICD-10-CM | POA: Diagnosis not present

## 2016-07-07 DIAGNOSIS — M48061 Spinal stenosis, lumbar region without neurogenic claudication: Secondary | ICD-10-CM | POA: Diagnosis not present

## 2016-08-06 DIAGNOSIS — Z79899 Other long term (current) drug therapy: Secondary | ICD-10-CM | POA: Diagnosis not present

## 2016-08-06 DIAGNOSIS — M48061 Spinal stenosis, lumbar region without neurogenic claudication: Secondary | ICD-10-CM | POA: Diagnosis not present

## 2016-08-06 DIAGNOSIS — Z6826 Body mass index (BMI) 26.0-26.9, adult: Secondary | ICD-10-CM | POA: Diagnosis not present

## 2016-08-06 DIAGNOSIS — M4714 Other spondylosis with myelopathy, thoracic region: Secondary | ICD-10-CM | POA: Diagnosis not present

## 2016-08-06 DIAGNOSIS — Z5181 Encounter for therapeutic drug level monitoring: Secondary | ICD-10-CM | POA: Diagnosis not present

## 2016-08-25 DIAGNOSIS — G8929 Other chronic pain: Secondary | ICD-10-CM | POA: Diagnosis not present

## 2016-08-25 DIAGNOSIS — Z87898 Personal history of other specified conditions: Secondary | ICD-10-CM | POA: Diagnosis not present

## 2016-08-25 DIAGNOSIS — M546 Pain in thoracic spine: Secondary | ICD-10-CM | POA: Diagnosis not present

## 2016-08-25 DIAGNOSIS — Z8249 Family history of ischemic heart disease and other diseases of the circulatory system: Secondary | ICD-10-CM | POA: Diagnosis not present

## 2016-08-25 DIAGNOSIS — F17200 Nicotine dependence, unspecified, uncomplicated: Secondary | ICD-10-CM | POA: Diagnosis not present

## 2016-08-25 DIAGNOSIS — F1411 Cocaine abuse, in remission: Secondary | ICD-10-CM | POA: Diagnosis not present

## 2016-08-25 DIAGNOSIS — E785 Hyperlipidemia, unspecified: Secondary | ICD-10-CM | POA: Diagnosis not present

## 2016-08-25 DIAGNOSIS — R41840 Attention and concentration deficit: Secondary | ICD-10-CM | POA: Diagnosis not present

## 2016-09-23 DIAGNOSIS — M4714 Other spondylosis with myelopathy, thoracic region: Secondary | ICD-10-CM | POA: Diagnosis not present

## 2016-09-23 DIAGNOSIS — M5124 Other intervertebral disc displacement, thoracic region: Secondary | ICD-10-CM | POA: Diagnosis not present

## 2016-10-12 DIAGNOSIS — M5416 Radiculopathy, lumbar region: Secondary | ICD-10-CM | POA: Diagnosis not present

## 2016-10-12 DIAGNOSIS — M961 Postlaminectomy syndrome, not elsewhere classified: Secondary | ICD-10-CM | POA: Diagnosis not present

## 2016-10-14 DIAGNOSIS — M5416 Radiculopathy, lumbar region: Secondary | ICD-10-CM | POA: Diagnosis not present

## 2016-11-12 DIAGNOSIS — M519 Unspecified thoracic, thoracolumbar and lumbosacral intervertebral disc disorder: Secondary | ICD-10-CM | POA: Diagnosis not present

## 2016-11-12 DIAGNOSIS — M48061 Spinal stenosis, lumbar region without neurogenic claudication: Secondary | ICD-10-CM | POA: Diagnosis not present

## 2016-11-12 DIAGNOSIS — M5416 Radiculopathy, lumbar region: Secondary | ICD-10-CM | POA: Diagnosis not present

## 2016-11-12 DIAGNOSIS — G5602 Carpal tunnel syndrome, left upper limb: Secondary | ICD-10-CM | POA: Diagnosis not present

## 2016-11-14 DIAGNOSIS — F322 Major depressive disorder, single episode, severe without psychotic features: Secondary | ICD-10-CM | POA: Diagnosis not present

## 2016-11-14 DIAGNOSIS — R45851 Suicidal ideations: Secondary | ICD-10-CM | POA: Diagnosis not present

## 2016-11-27 DIAGNOSIS — M5416 Radiculopathy, lumbar region: Secondary | ICD-10-CM | POA: Diagnosis not present

## 2016-11-27 DIAGNOSIS — M519 Unspecified thoracic, thoracolumbar and lumbosacral intervertebral disc disorder: Secondary | ICD-10-CM | POA: Diagnosis not present

## 2016-11-27 DIAGNOSIS — G5602 Carpal tunnel syndrome, left upper limb: Secondary | ICD-10-CM | POA: Diagnosis not present

## 2016-11-27 DIAGNOSIS — Z6825 Body mass index (BMI) 25.0-25.9, adult: Secondary | ICD-10-CM | POA: Diagnosis not present

## 2016-12-14 DIAGNOSIS — M5126 Other intervertebral disc displacement, lumbar region: Secondary | ICD-10-CM | POA: Diagnosis not present

## 2016-12-14 DIAGNOSIS — G5602 Carpal tunnel syndrome, left upper limb: Secondary | ICD-10-CM | POA: Diagnosis not present

## 2017-03-22 DIAGNOSIS — Z23 Encounter for immunization: Secondary | ICD-10-CM | POA: Diagnosis not present

## 2017-03-22 DIAGNOSIS — R030 Elevated blood-pressure reading, without diagnosis of hypertension: Secondary | ICD-10-CM | POA: Diagnosis not present

## 2017-03-22 DIAGNOSIS — M546 Pain in thoracic spine: Secondary | ICD-10-CM | POA: Diagnosis not present

## 2017-06-22 DIAGNOSIS — M5416 Radiculopathy, lumbar region: Secondary | ICD-10-CM | POA: Diagnosis not present

## 2017-06-22 DIAGNOSIS — M5126 Other intervertebral disc displacement, lumbar region: Secondary | ICD-10-CM | POA: Diagnosis not present

## 2017-06-22 DIAGNOSIS — R030 Elevated blood-pressure reading, without diagnosis of hypertension: Secondary | ICD-10-CM | POA: Diagnosis not present

## 2017-06-22 DIAGNOSIS — M546 Pain in thoracic spine: Secondary | ICD-10-CM | POA: Diagnosis not present

## 2017-07-12 DIAGNOSIS — M546 Pain in thoracic spine: Secondary | ICD-10-CM | POA: Diagnosis not present

## 2017-08-23 DIAGNOSIS — J180 Bronchopneumonia, unspecified organism: Secondary | ICD-10-CM | POA: Diagnosis not present

## 2017-08-30 DIAGNOSIS — R05 Cough: Secondary | ICD-10-CM | POA: Diagnosis not present

## 2017-08-30 DIAGNOSIS — J180 Bronchopneumonia, unspecified organism: Secondary | ICD-10-CM | POA: Diagnosis not present

## 2017-09-17 DIAGNOSIS — M546 Pain in thoracic spine: Secondary | ICD-10-CM | POA: Diagnosis not present

## 2017-09-17 DIAGNOSIS — M5416 Radiculopathy, lumbar region: Secondary | ICD-10-CM | POA: Diagnosis not present

## 2017-09-17 DIAGNOSIS — M542 Cervicalgia: Secondary | ICD-10-CM | POA: Diagnosis not present

## 2017-09-17 DIAGNOSIS — M5126 Other intervertebral disc displacement, lumbar region: Secondary | ICD-10-CM | POA: Diagnosis not present

## 2017-11-11 DIAGNOSIS — M5124 Other intervertebral disc displacement, thoracic region: Secondary | ICD-10-CM | POA: Diagnosis not present

## 2017-11-11 DIAGNOSIS — R200 Anesthesia of skin: Secondary | ICD-10-CM | POA: Diagnosis not present

## 2017-11-11 DIAGNOSIS — M5416 Radiculopathy, lumbar region: Secondary | ICD-10-CM | POA: Diagnosis not present

## 2017-11-16 ENCOUNTER — Other Ambulatory Visit (HOSPITAL_COMMUNITY): Payer: Self-pay | Admitting: Neurological Surgery

## 2017-11-16 ENCOUNTER — Other Ambulatory Visit: Payer: Self-pay | Admitting: Neurological Surgery

## 2017-11-16 DIAGNOSIS — M5416 Radiculopathy, lumbar region: Secondary | ICD-10-CM

## 2017-11-16 DIAGNOSIS — M5124 Other intervertebral disc displacement, thoracic region: Secondary | ICD-10-CM

## 2017-11-16 DIAGNOSIS — R200 Anesthesia of skin: Secondary | ICD-10-CM

## 2017-11-16 MED ORDER — ONDANSETRON HCL 40 MG/20ML IJ SOLN
4.0000 mg | Freq: Four times a day (QID) | INTRAMUSCULAR | Status: AC | PRN
Start: 2017-11-16 — End: ?

## 2017-12-03 ENCOUNTER — Ambulatory Visit (HOSPITAL_COMMUNITY)
Admission: RE | Admit: 2017-12-03 | Discharge: 2017-12-03 | Disposition: A | Discharge status: Home / Self Care | Payer: Medicare Other | Source: Ambulatory Visit | Attending: Neurological Surgery | Admitting: Neurological Surgery

## 2017-12-03 DIAGNOSIS — M5126 Other intervertebral disc displacement, lumbar region: Secondary | ICD-10-CM | POA: Diagnosis not present

## 2017-12-03 DIAGNOSIS — M4714 Other spondylosis with myelopathy, thoracic region: Secondary | ICD-10-CM | POA: Insufficient documentation

## 2017-12-03 DIAGNOSIS — Z981 Arthrodesis status: Secondary | ICD-10-CM | POA: Diagnosis not present

## 2017-12-03 DIAGNOSIS — M48062 Spinal stenosis, lumbar region with neurogenic claudication: Secondary | ICD-10-CM | POA: Insufficient documentation

## 2017-12-03 DIAGNOSIS — M5134 Other intervertebral disc degeneration, thoracic region: Secondary | ICD-10-CM | POA: Diagnosis not present

## 2017-12-03 DIAGNOSIS — M4802 Spinal stenosis, cervical region: Secondary | ICD-10-CM | POA: Insufficient documentation

## 2017-12-03 DIAGNOSIS — M5416 Radiculopathy, lumbar region: Secondary | ICD-10-CM

## 2017-12-03 DIAGNOSIS — M4712 Other spondylosis with myelopathy, cervical region: Secondary | ICD-10-CM | POA: Insufficient documentation

## 2017-12-03 DIAGNOSIS — M5124 Other intervertebral disc displacement, thoracic region: Secondary | ICD-10-CM

## 2017-12-03 DIAGNOSIS — M5021 Other cervical disc displacement,  high cervical region: Secondary | ICD-10-CM | POA: Diagnosis not present

## 2017-12-03 DIAGNOSIS — R200 Anesthesia of skin: Secondary | ICD-10-CM

## 2017-12-03 DIAGNOSIS — M48061 Spinal stenosis, lumbar region without neurogenic claudication: Secondary | ICD-10-CM | POA: Diagnosis not present

## 2017-12-03 MED ORDER — OXYCODONE-ACETAMINOPHEN 5-325 MG PO TABS
ORAL_TABLET | ORAL | Status: AC
  Administered 2017-12-03: 2 via ORAL
  Filled 2017-12-03: qty 2

## 2017-12-03 MED ORDER — IOPAMIDOL (ISOVUE-M 300) INJECTION 61%
15.0000 mL | Freq: Once | INTRAMUSCULAR | Status: AC | PRN
  Administered 2017-12-03: 10 mL via INTRATHECAL

## 2017-12-03 MED ORDER — ONDANSETRON HCL 4 MG/2ML IJ SOLN: 4.0000 mg | Freq: Four times a day (QID) | INTRAMUSCULAR | Status: DC | PRN

## 2017-12-03 MED ORDER — HYDROCODONE-ACETAMINOPHEN 5-325 MG PO TABS: 1.0000 | ORAL_TABLET | ORAL | Status: DC | PRN

## 2017-12-03 MED ORDER — LIDOCAINE HCL (PF) 1 % IJ SOLN: 5.0000 mL | Freq: Once | INTRAMUSCULAR | Status: DC

## 2017-12-03 MED ORDER — OXYCODONE-ACETAMINOPHEN 5-325 MG PO TABS
1.0000 | ORAL_TABLET | ORAL | Status: DC | PRN
  Administered 2017-12-03: 2 via ORAL

## 2017-12-03 MED ORDER — DIAZEPAM 5 MG PO TABS
10.0000 mg | ORAL_TABLET | Freq: Once | ORAL | Status: AC
  Administered 2017-12-03: 10 mg via ORAL
  Filled 2017-12-03: qty 2

## 2017-12-03 NOTE — Procedures (Signed)
Mr. Darrell Ramirez is a 52 year old individual whose had significant cervical spondylitic disease in the past these had myelopathy and radiculopathy and underwent a 3 level decompression at C5-6 C6-7 and C7-T1.  He has evidence of spondylitic changes at the T1-2 and T2-3 levels.  He also has had previous lumbar spondylosis with stenosis from L4 to the sacrum and has had a decompression there.  He has had recurrent symptoms including numbness and tingling in the upper extremities.  He is now evaluated with a total myelogram.  Pre op Dx: Cervical spondylosis, thoracic spondylosis with myelopathy, lumbar stenosis Post op Dx: Same Procedure: Total myelogram Surgeon: Keevan Wolz Puncture level: L3-4 Fluid color: Clear colorless Injection: Isovue-300, 10 mL Findings: Severe stenosis L3-4 above his previous fusion.  Spondylosis at the cervical thoracic junction.  Further evaluation with CT scanning.

## 2017-12-03 NOTE — Discharge Instructions (Signed)
Myelogram, Care After °These instructions give you information about caring for yourself after your procedure. Your doctor may also give you more specific instructions. Call your doctor if you have any problems or questions after your procedure. °Follow these instructions at home: °· Drink enough fluid to keep your pee (urine) clear or pale yellow. °· Rest as told by your doctor. °· Lie flat with your head slightly raised (elevated). °· Do not bend, lift, or do any hard activities for 24-48 hours or as told by your doctor. °· Take over-the-counter and prescription medicines only as told by your doctor. °· Take care of and remove your bandage (dressing) as told by your doctor. °· Bathe or shower as told by your doctor. °Contact a health care provider if: °· You have a fever. °· You have a headache that lasts longer than 24 hours. °· You feel sick to your stomach (nauseous). °· You throw up (vomit). °· Your neck is stiff. °· Your legs feel numb. °· You cannot pee. °· You cannot poop (have a bowel movement). °· You have a rash. °· You are itchy or sneezing. °Get help right away if: °· You have new symptoms or your symptoms get worse. °· You have a seizure. °· You have trouble breathing. °This information is not intended to replace advice given to you by your health care provider. Make sure you discuss any questions you have with your health care provider. °Document Released: 01/28/2008 Document Revised: 12/19/2015 Document Reviewed: 01/31/2015 °Elsevier Interactive Patient Education © 2018 [employer] ° °

## 2017-12-09 DIAGNOSIS — R030 Elevated blood-pressure reading, without diagnosis of hypertension: Secondary | ICD-10-CM | POA: Diagnosis not present

## 2017-12-09 DIAGNOSIS — M48062 Spinal stenosis, lumbar region with neurogenic claudication: Secondary | ICD-10-CM | POA: Diagnosis not present

## 2017-12-14 ENCOUNTER — Other Ambulatory Visit: Payer: Self-pay | Admitting: Neurological Surgery

## 2018-01-11 DIAGNOSIS — J00 Acute nasopharyngitis [common cold]: Secondary | ICD-10-CM | POA: Diagnosis not present

## 2018-01-11 DIAGNOSIS — Z716 Tobacco abuse counseling: Secondary | ICD-10-CM | POA: Diagnosis not present

## 2018-03-25 NOTE — Pre-Procedure Instructions (Signed)
Duvall Lucy Antigua Shorb  03/25/2018      [id number] - [place], [place] - [address] AT [facility] [address] [place] [id number] Phone: [phone] Fax: [phone]    Your procedure is scheduled on Mon., Dec. 2, 2019 from 11:43AM-2:38PM  [facility] "A" at 9:40AM  Call this number if you have problems the morning of surgery:  [phone]   Remember:  Do not eat or drink after midnight on Dec. 1st    Take these medicines the morning of surgery with A SIP OF WATER: Oxycodone HCl   As of today, stop taking all Other Aspirin Products, Vitamins, Fish oils, and Herbal medications. Also stop all NSAIDS i.e. Advil, Ibuprofen, Motrin, Aleve, Anaprox, Naproxen, BC, Goody Powders, and all Supplements.    Do not wear jewelry.  Do not wear lotions, powders, colognes, or deodorant.  Do not shave 48 hours prior to surgery.  Men may shave face.  Do not bring valuables to the hospital.  [facility] is not responsible for any belongings or valuables.  Contacts, dentures or bridgework may not be worn into surgery.  Leave your suitcase in the car.  After surgery it may be brought to your room.  For patients admitted to the hospital, discharge time will be determined by your treatment team.  Patients discharged the day of surgery will not be allowed to drive home.   Special instructions:  [facility]- Preparing For Surgery  Before surgery, you can play an important role. Because skin is not sterile, your skin needs to be as free of germs as possible. You can reduce the number of germs on your skin by washing with CHG (chlorahexidine gluconate) Soap before surgery.  CHG is an antiseptic cleaner which kills germs and bonds with the skin to continue killing germs even after washing.    Oral Hygiene is also important to reduce your risk of infection.  Remember - BRUSH YOUR TEETH THE MORNING OF SURGERY WITH YOUR  REGULAR TOOTHPASTE  Please do not use if you have an allergy to CHG or antibacterial soaps. If your skin becomes reddened/irritated stop using the CHG.  Do not shave (including legs and underarms) for at least 48 hours prior to first CHG shower. It is OK to shave your face.  Please follow these instructions carefully.   1. Shower the NIGHT BEFORE SURGERY and the MORNING OF SURGERY with CHG.   2. If you chose to wash your hair, wash your hair first as usual with your normal shampoo.  3. After you shampoo, rinse your hair and body thoroughly to remove the shampoo.  4. Use CHG as you would any other liquid soap. You can apply CHG directly to the skin and wash gently with a scrungie or a clean washcloth.   5. Apply the CHG Soap to your body ONLY FROM THE NECK DOWN.  Do not use on open wounds or open sores. Avoid contact with your eyes, ears, mouth and genitals (private parts). Wash Face and genitals (private parts)  with your normal soap.  6. Wash thoroughly, paying special attention to the area where your surgery will be performed.  7. Thoroughly rinse your body with warm water from the neck down.  8. DO NOT shower/wash with your normal soap after using and rinsing off the CHG Soap.  9. Pat yourself dry with a CLEAN TOWEL.  10. Wear  CLEAN PAJAMAS to bed the night before surgery, wear comfortable clothes the morning of surgery  11. Place CLEAN SHEETS on your bed the night of your first shower and DO NOT SLEEP WITH PETS.  Day of Surgery:  Do not apply any deodorants/lotions.  Please wear clean clothes to the hospital/surgery center.   Remember to brush your teeth WITH YOUR REGULAR TOOTHPASTE.  Please read over the following fact sheets that you were given. Pain Booklet, Coughing and Deep Breathing, MRSA Information and Surgical Site Infection Prevention

## 2018-03-28 ENCOUNTER — Encounter (HOSPITAL_COMMUNITY): Payer: Self-pay

## 2018-03-28 ENCOUNTER — Other Ambulatory Visit: Payer: Self-pay

## 2018-03-28 ENCOUNTER — Encounter (HOSPITAL_COMMUNITY)
Admission: RE | Admit: 2018-03-28 | Discharge: 2018-03-28 | Disposition: A | Discharge status: Home / Self Care | Payer: Medicare HMO | Source: Ambulatory Visit | Attending: Neurological Surgery | Admitting: Neurological Surgery

## 2018-03-28 DIAGNOSIS — Z01812 Encounter for preprocedural laboratory examination: Secondary | ICD-10-CM | POA: Diagnosis present

## 2018-03-28 LAB — COMPREHENSIVE METABOLIC PANEL
ALBUMIN: 4.1 g/dL (ref 3.5–5.0)
ALT: 16 U/L (ref 0–44)
AST: 17 U/L (ref 15–41)
Alkaline Phosphatase: 66 U/L (ref 38–126)
Anion gap: 6 (ref 5–15)
BUN: 9 mg/dL (ref 6–20)
CHLORIDE: 109 mmol/L (ref 98–111)
CO2: 25 mmol/L (ref 22–32)
CREATININE: 0.94 mg/dL (ref 0.61–1.24)
Calcium: 9.3 mg/dL (ref 8.9–10.3)
GFR calc Af Amer: >60 mL/min (ref >60)
GFR calc non Af Amer: >60 mL/min (ref >60)
Glucose, Bld: 101 mg/dL — ABNORMAL HIGH (ref 70–99)
Potassium: 4 mmol/L (ref 3.5–5.1)
SODIUM: 140 mmol/L (ref 135–145)
Total Bilirubin: 0.9 mg/dL (ref 0.3–1.2)
Total Protein: 6.9 g/dL (ref 6.5–8.1)

## 2018-03-28 LAB — SURGICAL PCR SCREEN
MRSA, PCR: NEGATIVE
Staphylococcus aureus: POSITIVE — AB

## 2018-03-28 LAB — TYPE AND SCREEN
ABO/RH(D): A POS
Antibody Screen: NEGATIVE

## 2018-03-28 LAB — CBC
HEMATOCRIT: 45.5 % (ref 39.0–52.0)
Hemoglobin: 14.8 g/dL (ref 13.0–17.0)
MCH: 30.1 pg (ref 26.0–34.0)
MCHC: 32.5 g/dL (ref 30.0–36.0)
MCV: 92.7 fL (ref 80.0–100.0)
NRBC: 0 % (ref 0.0–0.2)
Platelets: 388 10*3/uL (ref 150–400)
RBC: 4.91 MIL/uL (ref 4.22–5.81)
RDW: 12.8 % (ref 11.5–15.5)
WBC: 11.1 10*3/uL — ABNORMAL HIGH (ref 4.0–10.5)

## 2018-03-28 NOTE — Progress Notes (Signed)
PCP is Dr. Doreatha MartinGretchen Velazquez   LOV 08/2017 He denies any cardiac issues, no murmur, no cp, no sob.  No testing done He did say that he hasn't used cocaine in the last 3-4 yrs.

## 2018-03-28 NOTE — Pre-Procedure Instructions (Signed)
Issam Lucy Antigua Heiner  03/28/2018      [id number] - [place], [place] - [address] AT [facility] OF [address] [place] [id number] Phone: [phone] Fax: [phone]    Your procedure is scheduled on Mon., Dec. 2, 2019    [facility] "A" at 0530 AM             (posted surgery time 7:30A - 10:25A)   Call this number if you have problems the morning of surgery:  [phone]   Remember:  Do not eat or drink after midnight on Sunday,  Dec. 1st    Take these medicines the morning of surgery with A SIP OF WATER: Oxycodone HCl   As of today, stop taking all Other Aspirin Products, Vitamins, Fish oils, and Herbal medications. Also stop all NSAIDS i.e. Advil, Ibuprofen, Motrin, Aleve, Anaprox, Naproxen, BC, Goody Powders, and all Supplements.    Do not wear jewelry.  Do not wear lotions,  colognes, or deodorant.             Men may shave face.  Do not bring valuables to the hospital.  [facility] is not responsible for any belongings or valuables.  Contacts, dentures or bridgework may not be worn into surgery.  Leave your suitcase in the car.  After surgery it may be brought to your room.  For patients admitted to the hospital, discharge time will be determined by your treatment team.       [facility]- Pre[facility] For [facility]  Before surgery, you can play an important role. Because skin is not sterile, your skin needs to be as free of germs as possible. You can reduce the number of germs on your skin by washing with CHG (chlorahexidine gluconate) Soap before surgery.  CHG is an antiseptic cleaner which kills germs and bonds with the skin to continue killing germs even after washing.    Oral Hygiene is also important to reduce your risk of infection.    Remember - BRUSH YOUR TEETH THE MORNING OF SURGERY WITH YOUR REGULAR TOOTHPASTE  Please do not use if you have an allergy to CHG or  antibacterial soaps. If your skin becomes reddened/irritated stop using the CHG.  Do not shave (including legs and underarms) for at least 48 hours prior to first CHG shower. It is OK to shave your face.  Please follow these instructions carefully.   1. Shower the NIGHT BEFORE SURGERY and the MORNING OF SURGERY with CHG.   2. If you chose to wash your hair, wash your hair first as usual with your normal shampoo.  3. After you shampoo, rinse your hair and body thoroughly to remove the shampoo.  4. Use CHG as you would any other liquid soap. You can apply CHG directly to the skin and wash gently with a scrungie or a clean washcloth.   5. Apply the CHG Soap to your body ONLY FROM THE NECK DOWN.  Do not use on open wounds or open sores. Avoid contact with your eyes, ears, mouth and genitals (private parts). Wash Face and genitals (private parts)  with your normal soap.  6. Wash thoroughly, paying special attention to the area where your surgery will be performed.  7. Thoroughly rinse your body with warm water from the neck down.  8. DO NOT shower/wash with your normal soap after using and rinsing off the CHG  Soap.  9. Pat yourself dry with a CLEAN TOWEL.  10. Wear CLEAN PAJAMAS to bed the night before surgery, wear comfortable clothes the morning of surgery  11. Place CLEAN SHEETS on your bed the night of your first shower and DO NOT SLEEP WITH PETS.  Day of Surgery:  Do not apply any deodorants/lotions.  Please wear clean clothes to the hospital/surgery center.   Remember to brush your teeth WITH YOUR REGULAR TOOTHPASTE.  Please read over the following fact sheets that you were given. Pain Booklet, MRSA Information and Surgical Site Infection Prevention

## 2018-04-04 ENCOUNTER — Inpatient Hospital Stay (HOSPITAL_COMMUNITY): Payer: Medicare HMO | Admitting: Anesthesiology

## 2018-04-04 ENCOUNTER — Other Ambulatory Visit: Payer: Self-pay

## 2018-04-04 ENCOUNTER — Encounter (HOSPITAL_COMMUNITY)
Admission: RE | Disposition: A | Discharge status: Home / Self Care | Payer: Self-pay | Source: Ambulatory Visit | Attending: Neurological Surgery

## 2018-04-04 ENCOUNTER — Encounter (HOSPITAL_COMMUNITY): Payer: Self-pay

## 2018-04-04 ENCOUNTER — Inpatient Hospital Stay (HOSPITAL_COMMUNITY): Payer: Medicare HMO

## 2018-04-04 ENCOUNTER — Inpatient Hospital Stay (HOSPITAL_COMMUNITY)
Admission: RE | Admit: 2018-04-04 | Discharge: 2018-04-05 | DRG: 460 | Disposition: A | Discharge status: Home / Self Care | Payer: Medicare HMO | Source: Ambulatory Visit | Attending: Neurological Surgery | Admitting: Neurological Surgery

## 2018-04-04 DIAGNOSIS — K219 Gastro-esophageal reflux disease without esophagitis: Secondary | ICD-10-CM | POA: Diagnosis present

## 2018-04-04 DIAGNOSIS — M4726 Other spondylosis with radiculopathy, lumbar region: Secondary | ICD-10-CM | POA: Diagnosis present

## 2018-04-04 DIAGNOSIS — F17210 Nicotine dependence, cigarettes, uncomplicated: Secondary | ICD-10-CM | POA: Diagnosis present

## 2018-04-04 DIAGNOSIS — Z886 Allergy status to analgesic agent status: Secondary | ICD-10-CM

## 2018-04-04 DIAGNOSIS — Z885 Allergy status to narcotic agent status: Secondary | ICD-10-CM | POA: Diagnosis not present

## 2018-04-04 DIAGNOSIS — Z981 Arthrodesis status: Secondary | ICD-10-CM | POA: Diagnosis not present

## 2018-04-04 DIAGNOSIS — Z79899 Other long term (current) drug therapy: Secondary | ICD-10-CM | POA: Diagnosis not present

## 2018-04-04 DIAGNOSIS — M48062 Spinal stenosis, lumbar region with neurogenic claudication: Principal | ICD-10-CM | POA: Diagnosis present

## 2018-04-04 DIAGNOSIS — Z419 Encounter for procedure for purposes other than remedying health state, unspecified: Secondary | ICD-10-CM

## 2018-04-04 DIAGNOSIS — G4730 Sleep apnea, unspecified: Secondary | ICD-10-CM | POA: Diagnosis present

## 2018-04-04 HISTORY — PX: ANTERIOR LAT LUMBAR FUSION: SHX1168

## 2018-04-04 SURGERY — ANTERIOR LATERAL LUMBAR FUSION 1 LEVEL
Anesthesia: General | Site: Spine Lumbar

## 2018-04-04 MED ORDER — ONDANSETRON HCL 4 MG PO TABS: 4.0000 mg | ORAL_TABLET | Freq: Four times a day (QID) | ORAL | Status: DC | PRN

## 2018-04-04 MED ORDER — ACETAMINOPHEN 650 MG RE SUPP
650.0000 mg | RECTAL | Status: DC | PRN
  Filled 2018-04-04: qty 1

## 2018-04-04 MED ORDER — EPHEDRINE 5 MG/ML INJ
INTRAVENOUS | Status: AC
  Filled 2018-04-04: qty 10

## 2018-04-04 MED ORDER — CEFAZOLIN SODIUM-DEXTROSE 2-4 GM/100ML-% IV SOLN
2.0000 g | Freq: Three times a day (TID) | INTRAVENOUS | Status: AC
  Administered 2018-04-04 – 2018-04-05 (×2): 2 g via INTRAVENOUS
  Filled 2018-04-04 (×2): qty 100

## 2018-04-04 MED ORDER — PHENYLEPHRINE 40 MCG/ML (10ML) SYRINGE FOR IV PUSH (FOR BLOOD PRESSURE SUPPORT)
PREFILLED_SYRINGE | INTRAVENOUS | Status: AC
  Filled 2018-04-04: qty 10

## 2018-04-04 MED ORDER — LIDOCAINE-EPINEPHRINE 1 %-1:100000 IJ SOLN
INTRAMUSCULAR | Status: DC | PRN
  Administered 2018-04-04: 5 mL

## 2018-04-04 MED ORDER — PROPOFOL 10 MG/ML IV BOLUS
INTRAVENOUS | Status: DC | PRN
  Administered 2018-04-04: 140 mg via INTRAVENOUS
  Administered 2018-04-04: 40 mg via INTRAVENOUS
  Administered 2018-04-04: 20 mg via INTRAVENOUS

## 2018-04-04 MED ORDER — OXYCODONE HCL 5 MG PO TABS
10.0000 mg | ORAL_TABLET | ORAL | Status: DC | PRN
  Administered 2018-04-04 – 2018-04-05 (×3): 10 mg via ORAL
  Filled 2018-04-04 (×3): qty 2

## 2018-04-04 MED ORDER — METHOCARBAMOL 1000 MG/10ML IJ SOLN
500.0000 mg | Freq: Four times a day (QID) | INTRAVENOUS | Status: DC | PRN
  Filled 2018-04-04: qty 5

## 2018-04-04 MED ORDER — LIDOCAINE 2% (20 MG/ML) 5 ML SYRINGE
INTRAMUSCULAR | Status: AC
  Filled 2018-04-04: qty 10

## 2018-04-04 MED ORDER — SUGAMMADEX SODIUM 500 MG/5ML IV SOLN
INTRAVENOUS | Status: AC
  Filled 2018-04-04: qty 5

## 2018-04-04 MED ORDER — BUPIVACAINE HCL (PF) 0.5 % IJ SOLN
INTRAMUSCULAR | Status: AC
  Filled 2018-04-04: qty 30

## 2018-04-04 MED ORDER — OXYCODONE HCL 5 MG PO TABS
20.0000 mg | ORAL_TABLET | Freq: Three times a day (TID) | ORAL | Status: DC
  Administered 2018-04-04 – 2018-04-05 (×4): 20 mg via ORAL
  Filled 2018-04-04 (×4): qty 4

## 2018-04-04 MED ORDER — FLEET ENEMA 7-19 GM/118ML RE ENEM: 1.0000 | ENEMA | Freq: Once | RECTAL | Status: DC | PRN

## 2018-04-04 MED ORDER — EPHEDRINE SULFATE 50 MG/ML IJ SOLN
INTRAMUSCULAR | Status: DC | PRN
  Administered 2018-04-04 (×2): 10 mg via INTRAVENOUS

## 2018-04-04 MED ORDER — FENTANYL CITRATE (PF) 100 MCG/2ML IJ SOLN
INTRAMUSCULAR | Status: AC
  Filled 2018-04-04: qty 2

## 2018-04-04 MED ORDER — DEXAMETHASONE SODIUM PHOSPHATE 10 MG/ML IJ SOLN
INTRAMUSCULAR | Status: AC
  Filled 2018-04-04: qty 1

## 2018-04-04 MED ORDER — BISACODYL 10 MG RE SUPP: 10.0000 mg | Freq: Every day | RECTAL | Status: DC | PRN

## 2018-04-04 MED ORDER — SUCCINYLCHOLINE CHLORIDE 200 MG/10ML IV SOSY
PREFILLED_SYRINGE | INTRAVENOUS | Status: AC
  Filled 2018-04-04: qty 10

## 2018-04-04 MED ORDER — SUCCINYLCHOLINE CHLORIDE 20 MG/ML IJ SOLN
INTRAMUSCULAR | Status: DC | PRN
  Administered 2018-04-04: 70 mg via INTRAVENOUS

## 2018-04-04 MED ORDER — PROPOFOL 10 MG/ML IV BOLUS
INTRAVENOUS | Status: AC
  Filled 2018-04-04: qty 20

## 2018-04-04 MED ORDER — SENNA 8.6 MG PO TABS
1.0000 | ORAL_TABLET | Freq: Two times a day (BID) | ORAL | Status: DC
  Administered 2018-04-04 – 2018-04-05 (×3): 8.6 mg via ORAL
  Filled 2018-04-04 (×3): qty 1

## 2018-04-04 MED ORDER — ACETAMINOPHEN 325 MG PO TABS: 650.0000 mg | ORAL_TABLET | ORAL | Status: DC | PRN

## 2018-04-04 MED ORDER — SODIUM CHLORIDE 0.9% FLUSH
3.0000 mL | Freq: Two times a day (BID) | INTRAVENOUS | Status: DC
  Administered 2018-04-04 – 2018-04-05 (×2): 3 mL via INTRAVENOUS

## 2018-04-04 MED ORDER — PHENOL 1.4 % MT LIQD: 1.0000 | OROMUCOSAL | Status: DC | PRN

## 2018-04-04 MED ORDER — OXYCODONE HCL 5 MG PO TABS: 5.0000 mg | ORAL_TABLET | Freq: Once | ORAL | Status: DC | PRN

## 2018-04-04 MED ORDER — ACETAMINOPHEN 500 MG PO TABS: 1000.0000 mg | ORAL_TABLET | Freq: Once | ORAL | Status: DC | PRN

## 2018-04-04 MED ORDER — METHOCARBAMOL 500 MG PO TABS
ORAL_TABLET | ORAL | Status: AC
  Filled 2018-04-04: qty 1

## 2018-04-04 MED ORDER — POLYETHYLENE GLYCOL 3350 17 G PO PACK: 17.0000 g | PACK | Freq: Every day | ORAL | Status: DC | PRN

## 2018-04-04 MED ORDER — SODIUM CHLORIDE 0.9 % IV SOLN: 250.0000 mL | INTRAVENOUS | Status: DC

## 2018-04-04 MED ORDER — LIDOCAINE HCL (CARDIAC) PF 100 MG/5ML IV SOSY
PREFILLED_SYRINGE | INTRAVENOUS | Status: DC | PRN
  Administered 2018-04-04: 60 mg via INTRATRACHEAL

## 2018-04-04 MED ORDER — FENTANYL CITRATE (PF) 250 MCG/5ML IJ SOLN
INTRAMUSCULAR | Status: AC
  Filled 2018-04-04: qty 5

## 2018-04-04 MED ORDER — ACETAMINOPHEN 160 MG/5ML PO SOLN: 1000.0000 mg | Freq: Once | ORAL | Status: DC | PRN

## 2018-04-04 MED ORDER — ONDANSETRON HCL 4 MG/2ML IJ SOLN: 4.0000 mg | Freq: Four times a day (QID) | INTRAMUSCULAR | Status: DC | PRN

## 2018-04-04 MED ORDER — ACETAMINOPHEN 10 MG/ML IV SOLN
INTRAVENOUS | Status: AC
  Filled 2018-04-04: qty 100

## 2018-04-04 MED ORDER — KETAMINE HCL 50 MG/ML IJ SOLN
INTRAMUSCULAR | Status: DC | PRN
  Administered 2018-04-04: 30 mg via INTRAMUSCULAR
  Administered 2018-04-04: 20 mg via INTRAMUSCULAR

## 2018-04-04 MED ORDER — SODIUM CHLORIDE 0.9 % IV SOLN
INTRAVENOUS | Status: DC | PRN
  Administered 2018-04-04: 25 ug/min via INTRAVENOUS

## 2018-04-04 MED ORDER — THROMBIN 5000 UNITS EX SOLR
CUTANEOUS | Status: AC
  Filled 2018-04-04: qty 5000

## 2018-04-04 MED ORDER — LACTATED RINGERS IV SOLN
INTRAVENOUS | Status: DC | PRN
  Administered 2018-04-04 (×2): via INTRAVENOUS

## 2018-04-04 MED ORDER — KETAMINE HCL 50 MG/5ML IJ SOSY
PREFILLED_SYRINGE | INTRAMUSCULAR | Status: AC
  Filled 2018-04-04: qty 5

## 2018-04-04 MED ORDER — ALUM & MAG HYDROXIDE-SIMETH 200-200-20 MG/5ML PO SUSP: 30.0000 mL | Freq: Four times a day (QID) | ORAL | Status: DC | PRN

## 2018-04-04 MED ORDER — ONDANSETRON HCL 4 MG/2ML IJ SOLN
INTRAMUSCULAR | Status: AC
  Filled 2018-04-04: qty 2

## 2018-04-04 MED ORDER — SODIUM CHLORIDE 0.9 % IV SOLN
INTRAVENOUS | Status: DC | PRN
  Administered 2018-04-04: 500 mL

## 2018-04-04 MED ORDER — MENTHOL 3 MG MT LOZG: 1.0000 | LOZENGE | OROMUCOSAL | Status: DC | PRN

## 2018-04-04 MED ORDER — CEFAZOLIN SODIUM-DEXTROSE 2-4 GM/100ML-% IV SOLN
INTRAVENOUS | Status: AC
  Filled 2018-04-04: qty 100

## 2018-04-04 MED ORDER — FENTANYL CITRATE (PF) 100 MCG/2ML IJ SOLN
25.0000 ug | INTRAMUSCULAR | Status: DC | PRN
  Administered 2018-04-04 (×2): 50 ug via INTRAVENOUS

## 2018-04-04 MED ORDER — CHLORHEXIDINE GLUCONATE CLOTH 2 % EX PADS: 6.0000 | MEDICATED_PAD | Freq: Once | CUTANEOUS | Status: DC

## 2018-04-04 MED ORDER — MIDAZOLAM HCL 2 MG/2ML IJ SOLN
INTRAMUSCULAR | Status: AC
  Filled 2018-04-04: qty 2

## 2018-04-04 MED ORDER — ACETAMINOPHEN 10 MG/ML IV SOLN
1000.0000 mg | Freq: Once | INTRAVENOUS | Status: DC | PRN
  Administered 2018-04-04: 1000 mg via INTRAVENOUS

## 2018-04-04 MED ORDER — ROCURONIUM BROMIDE 50 MG/5ML IV SOSY
PREFILLED_SYRINGE | INTRAVENOUS | Status: AC
  Filled 2018-04-04: qty 5

## 2018-04-04 MED ORDER — THROMBIN 5000 UNITS EX SOLR
OROMUCOSAL | Status: DC | PRN
  Administered 2018-04-04: 5 mL via TOPICAL

## 2018-04-04 MED ORDER — FENTANYL CITRATE (PF) 250 MCG/5ML IJ SOLN
INTRAMUSCULAR | Status: DC | PRN
  Administered 2018-04-04: 100 ug via INTRAVENOUS
  Administered 2018-04-04: 150 ug via INTRAVENOUS
  Administered 2018-04-04 (×2): 100 ug via INTRAVENOUS
  Administered 2018-04-04: 150 ug via INTRAVENOUS
  Administered 2018-04-04: 100 ug via INTRAVENOUS

## 2018-04-04 MED ORDER — SODIUM CHLORIDE 0.9% FLUSH: 3.0000 mL | INTRAVENOUS | Status: DC | PRN

## 2018-04-04 MED ORDER — BUPIVACAINE HCL (PF) 0.5 % IJ SOLN
INTRAMUSCULAR | Status: DC | PRN
  Administered 2018-04-04: 5 mL

## 2018-04-04 MED ORDER — DEXAMETHASONE SODIUM PHOSPHATE 10 MG/ML IJ SOLN
INTRAMUSCULAR | Status: DC | PRN
  Administered 2018-04-04: 8 mg via INTRAVENOUS

## 2018-04-04 MED ORDER — LACTATED RINGERS IV SOLN: INTRAVENOUS | Status: DC

## 2018-04-04 MED ORDER — PHENYLEPHRINE HCL 10 MG/ML IJ SOLN
INTRAMUSCULAR | Status: DC | PRN
  Administered 2018-04-04: 80 ug via INTRAVENOUS

## 2018-04-04 MED ORDER — MORPHINE SULFATE (PF) 2 MG/ML IV SOLN
2.0000 mg | INTRAVENOUS | Status: DC | PRN
  Administered 2018-04-04 – 2018-04-05 (×5): 2 mg via INTRAVENOUS
  Filled 2018-04-04 (×5): qty 1

## 2018-04-04 MED ORDER — MIDAZOLAM HCL 5 MG/5ML IJ SOLN
INTRAMUSCULAR | Status: DC | PRN
  Administered 2018-04-04: 2 mg via INTRAVENOUS

## 2018-04-04 MED ORDER — CEFAZOLIN SODIUM-DEXTROSE 2-4 GM/100ML-% IV SOLN
2.0000 g | INTRAVENOUS | Status: AC
  Administered 2018-04-04: 2 g via INTRAVENOUS

## 2018-04-04 MED ORDER — OXYCODONE HCL 5 MG/5ML PO SOLN: 5.0000 mg | Freq: Once | ORAL | Status: DC | PRN

## 2018-04-04 MED ORDER — METHOCARBAMOL 500 MG PO TABS
500.0000 mg | ORAL_TABLET | Freq: Four times a day (QID) | ORAL | Status: DC | PRN
  Administered 2018-04-04 – 2018-04-05 (×4): 500 mg via ORAL
  Filled 2018-04-04 (×3): qty 1

## 2018-04-04 MED ORDER — DOCUSATE SODIUM 100 MG PO CAPS
100.0000 mg | ORAL_CAPSULE | Freq: Two times a day (BID) | ORAL | Status: DC
  Administered 2018-04-04 – 2018-04-05 (×3): 100 mg via ORAL
  Filled 2018-04-04 (×3): qty 1

## 2018-04-04 MED ORDER — OXYCODONE HCL 5 MG PO TABS
ORAL_TABLET | ORAL | Status: AC
  Filled 2018-04-04: qty 2

## 2018-04-04 MED ORDER — 0.9 % SODIUM CHLORIDE (POUR BTL) OPTIME
TOPICAL | Status: DC | PRN
  Administered 2018-04-04: 1000 mL

## 2018-04-04 MED ORDER — LIDOCAINE-EPINEPHRINE 1 %-1:100000 IJ SOLN
INTRAMUSCULAR | Status: AC
  Filled 2018-04-04: qty 1

## 2018-04-04 MED ORDER — ONDANSETRON HCL 4 MG/2ML IJ SOLN
INTRAMUSCULAR | Status: DC | PRN
  Administered 2018-04-04: 4 mg via INTRAVENOUS

## 2018-04-04 SURGICAL SUPPLY — 57 items
ADH SKN CLS APL DERMABOND .7 (GAUZE/BANDAGES/DRESSINGS) ×2
APL SKNCLS STERI-STRIP NONHPOA (GAUZE/BANDAGES/DRESSINGS) ×1
BENZOIN TINCTURE PRP APPL 2/3 (GAUZE/BANDAGES/DRESSINGS) ×2 IMPLANT
BLADE CLIPPER SURG (BLADE) IMPLANT
BOLT PLATE XLIF 5.5X55 LRG (Bolt) ×1 IMPLANT
BOLT PLATE XLIF 5.5X55MM LRG (Bolt) ×1 IMPLANT
BOLT PLATE XLIF 5.5X60 LRG (Bolt) ×1 IMPLANT
BOLT PLATE XLIF 5.5X60MM LRG (Bolt) ×1 IMPLANT
BONE MATRIX OSTEOCEL PRO MED (Bone Implant) ×2 IMPLANT
CLOSURE STERI-STRIP 1/2X4 (GAUZE/BANDAGES/DRESSINGS) ×2
CLSR STERI-STRIP ANTIMIC 1/2X4 (GAUZE/BANDAGES/DRESSINGS) ×2 IMPLANT
COVER WAND RF STERILE (DRAPES) ×3 IMPLANT
DERMABOND ADVANCED (GAUZE/BANDAGES/DRESSINGS) ×4
DERMABOND ADVANCED .7 DNX12 (GAUZE/BANDAGES/DRESSINGS) ×1 IMPLANT
DRAPE C-ARM 42X72 X-RAY (DRAPES) ×3 IMPLANT
DRAPE C-ARMOR (DRAPES) ×3 IMPLANT
DRAPE LAPAROTOMY 100X72X124 (DRAPES) ×3 IMPLANT
DRAPE POUCH INSTRU U-SHP 10X18 (DRAPES) ×3 IMPLANT
DRSG OPSITE POSTOP 3X4 (GAUZE/BANDAGES/DRESSINGS) ×4 IMPLANT
DURAPREP 26ML APPLICATOR (WOUND CARE) ×3 IMPLANT
ELECT REM PT RETURN 9FT ADLT (ELECTROSURGICAL) ×3
ELECTRODE REM PT RTRN 9FT ADLT (ELECTROSURGICAL) ×1 IMPLANT
GAUZE 4X4 16PLY RFD (DISPOSABLE) IMPLANT
GLOVE BIOGEL PI IND STRL 7.0 (GLOVE) IMPLANT
GLOVE BIOGEL PI IND STRL 7.5 (GLOVE) IMPLANT
GLOVE BIOGEL PI IND STRL 8.5 (GLOVE) ×1 IMPLANT
GLOVE BIOGEL PI INDICATOR 7.0 (GLOVE) ×2
GLOVE BIOGEL PI INDICATOR 7.5 (GLOVE) ×4
GLOVE BIOGEL PI INDICATOR 8.5 (GLOVE) ×2
GLOVE ECLIPSE 8.5 STRL (GLOVE) ×3 IMPLANT
GLOVE EXAM NITRILE XL STR (GLOVE) IMPLANT
GLOVE SURG SS PI 7.5 STRL IVOR (GLOVE) ×8 IMPLANT
GOWN STRL REUS W/ TWL LRG LVL3 (GOWN DISPOSABLE) IMPLANT
GOWN STRL REUS W/ TWL XL LVL3 (GOWN DISPOSABLE) ×1 IMPLANT
GOWN STRL REUS W/TWL 2XL LVL3 (GOWN DISPOSABLE) ×3 IMPLANT
GOWN STRL REUS W/TWL LRG LVL3 (GOWN DISPOSABLE) ×6
GOWN STRL REUS W/TWL XL LVL3 (GOWN DISPOSABLE) ×3
HEMOSTAT POWDER KIT SURGIFOAM (HEMOSTASIS) ×2 IMPLANT
KIT BASIN OR (CUSTOM PROCEDURE TRAY) ×3 IMPLANT
KIT DILATOR XLIF 5 (KITS) ×1 IMPLANT
KIT SURGICAL ACCESS MAXCESS 4 (KITS) ×2 IMPLANT
KIT TURNOVER KIT B (KITS) ×3 IMPLANT
KIT XLIF (KITS) ×1
MODULE NVM5 NEXT GEN EMG (NEEDLE) ×2 IMPLANT
MODULUS XLW 12X22X55MM 10 (Spine Construct) ×2 IMPLANT
NDL HYPO 25X1 1.5 SAFETY (NEEDLE) ×1 IMPLANT
NEEDLE HYPO 25X1 1.5 SAFETY (NEEDLE) ×3 IMPLANT
NS IRRIG 1000ML POUR BTL (IV SOLUTION) ×3 IMPLANT
PACK LAMINECTOMY NEURO (CUSTOM PROCEDURE TRAY) ×3 IMPLANT
PLATE DECADE XLIP 2H SZ12 (Plate) ×2 IMPLANT
SPONGE LAP 4X18 RFD (DISPOSABLE) IMPLANT
SUT VIC AB 2-0 CP2 18 (SUTURE) ×3 IMPLANT
SUT VIC AB 3-0 SH 8-18 (SUTURE) ×5 IMPLANT
TOWEL GREEN STERILE (TOWEL DISPOSABLE) ×3 IMPLANT
TOWEL GREEN STERILE FF (TOWEL DISPOSABLE) ×3 IMPLANT
TRAY FOLEY MTR SLVR 16FR STAT (SET/KITS/TRAYS/PACK) ×3 IMPLANT
WATER STERILE IRR 1000ML POUR (IV SOLUTION) ×3 IMPLANT

## 2018-04-04 NOTE — Op Note (Addendum)
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 Surgeon: Barnett AbuHenry Rodel Glaspy Indications: Darrell Ramirez is a 52 year old individuals had significant back and bilateral lower extremity pain has had previous spondylosis and stenosis at L4-5 and L5-S1 having undergone a posterior lumbar interbody arthrodesis there.  He is developed adjacent level disease at L3-L4 with a moderately severe stenosis at that level is been advised regarding an anterolateral indirect decompression and lateral fixation.  He is now being taken to the operating room for that procedure.  Procedure: Patient was brought to the operating room supine on the stretcher after the smooth induction of general endotracheal anesthesia he was placed into the right lateral decubitus position and right carefully taping his body into this position were able to check orthogonal radiographs and verified location of the L3-L4 interspace.  Skin entry sites were chosen on the basis of this in both lateral and posterior incision were then created after prepping the skin with alcohol DuraPrep and draped in sterile fashion.  A guide tube was placed over the midportion of the disc space at L3-4.  No monitoring was performed to make sure that none of the elements of the lumbar plexus were involved in a series of dilators were passed to allow placement of 140 mm deep retractor over the lateral aspect of the disc space where there was a large osteophyte at L3-L4.  After carefully examining this area making sure that none of the neural elements were near the posterior aspect of the retractor a shim was placed into the interspace at L3-L4.  Then carefully large lateral osteophyte was  removed to enter the disc space at L3-L4.  The disc space was then traversed with a Cobb elevator both directed superiorly and inferiorly to open up the contralateral portion of the ligament.  A series of rongeurs were then used to remove substantial quantities of severely degenerated disc material.  After the disc space was evacuated significantly a trial spacer was placed and initially an 8 x 18 mm spacer was followed by a 10 mm lordotic spacer and ultimately a 12 mm x 22 mm lordotic spacer this appeared to distract the interspace adequately and provide a modest amount of lordosis.  With this care was taken to make sure that the endplates were adequately decorticated a 55 mm wide titanium spacer was then filled with ostia cell allograft and placed into the interspace under fluoroscopic guidance once the spacer was set the lateral aspect of the muscle was then carefully dissected away to allow placement of a 12 mm tall plate the plate was a 2 screw dynamic compression plate and was secured to the superior portion of the vertebral body of L4 and the inferior portion of vertebral body of L3 final radiographs were obtained after the system was locked into position and demonstrated good position of the hardware with this the retractors were carefully removed good hemostasis was observed and then the fascia was closed with 2-0 Vicryl posteriorly and laterally and 3-0 Vicryl subcuticularly blood loss for the entire procedure was estimated at approximately 50 cc patient tolerated procedure well is returned to recovery room in stable condition.  EMG monitoring throughout the entirety of the procedure showed no hyperactivity of either the ipsilateral or contralateral lumbar plexus roots

## 2018-04-04 NOTE — Plan of Care (Signed)
  Problem: Education: Goal: Ability to verbalize activity precautions or restrictions will improve Outcome: Progressing Goal: Knowledge of the prescribed therapeutic regimen will improve Outcome: Progressing Goal: Understanding of discharge needs will improve Outcome: Progressing   Problem: Pain Management: Goal: Pain level will decrease Outcome: Progressing   Problem: Bladder/Genitourinary: Goal: Urinary functional status for postoperative course will improve Outcome: Progressing   Problem: Safety: Goal: Ability to remain free from injury will improve Outcome: Progressing   Problem: Pain Managment: Goal: General experience of comfort will improve Outcome: Progressing

## 2018-04-04 NOTE — Transfer of Care (Signed)
Immediate Anesthesia Transfer of Care Note  Patient: Darrell BerlinRay E Ramirez  Procedure(s) Performed: Lumbar three-four Anterolateral decompression/fusion with lateral plate fixation (N/A Spine Lumbar)  Patient Location: PACU  Anesthesia Type:General  Level of Consciousness: awake, alert  and oriented  Airway & Oxygen Therapy: Patient Spontanous Breathing and Patient connected to nasal cannula oxygen  Post-op Assessment: Report given to RN and Post -op Vital signs reviewed and stable  Post vital signs: Reviewed and stable  Last Vitals:  Vitals Value Taken Time  BP 120/82 04/04/2018  9:50 AM  Temp 36.1 C 04/04/2018  9:50 AM  Pulse 95 04/04/2018  9:50 AM  Resp 24 04/04/2018  9:50 AM  SpO2 98 % 04/04/2018  9:50 AM    Last Pain:  Vitals:   04/04/18 0603  TempSrc: Oral  PainSc:          Complications: No apparent anesthesia complications

## 2018-04-04 NOTE — Progress Notes (Signed)
Patient ID: Darrell Ramirez E Bents, male   DOB: [birth year], 52 y.o.   MRN: [id number] Vital signs are stable Left thigh pain is significant He is able to move the thigh and flex the hip We will mobilize as tolerated in the next day or so

## 2018-04-04 NOTE — H&P (Signed)
Darrell Ramirez is an 52 y.o. male.   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  Is having some C8 radicular symptoms recently in addition to significant back and right lower extremity pain in the sense of some weakness proximally in the right leg.  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  He has moderate facet hypertrophy bulging of the disc that is broad-based however it is felt that an indirect decompression at the L3-L4 level with a stabilization procedure there should give him adequate decompression of the central canal to relieve the worst of his symptoms.  He is now admitted for that procedure.  Past Medical History:  Diagnosis Date  . Arthritis   . Constipation   . GERD (gastroesophageal reflux disease) otc mrdication  . Sleep apnea    has not been tested.      Past Surgical History:  Procedure Laterality Date  . ANTERIOR CERVICAL DECOMP/DISCECTOMY FUSION  02/29/2012   Procedure: ANTERIOR CERVICAL DECOMPRESSION/DISCECTOMY FUSION 3 LEVELS;  Surgeon: Barnett Abu, MD;  Location: [facility];  Service: Neurosurgery;  Laterality: N/A;  Cervical Four-Five, Cervical Five-Six,Cervical Six-Seven Anterior cervical decompression/diskectomy/fusion  . arthroscopic knee     times 2 right and left  . BACK SURGERY  x 3  . HAND SURGERY     nerve repair from laceration    History reviewed. No pertinent family history. Social History:  reports that he has been smoking cigarettes. He has a 10.00 pack-year smoking history. He has never used smokeless tobacco. He reports that he drinks about 2.0 standard drinks of alcohol per week. He reports that he does not use drugs.  Allergies:   Allergies  Allergen Reactions  . Vicodin [Hydrocodone-Acetaminophen] Itching  . Tylenol Pm Extra [Diphenhydramine-Apap (Sleep)] Other (See Comments)    Insomnia     Medications Prior to Admission  Medication Sig Dispense Refill  . diclofenac sodium (VOLTAREN) 1 % GEL Apply 1 application topically every other day.    . indomethacin (INDOCIN) 50 MG capsule Take 50 mg by mouth 2 (two) times daily with a meal.    . Multiple Vitamin (MULTIVITAMIN WITH MINERALS) TABS tablet Take 1 tablet by mouth daily.    . Oxycodone HCl 10 MG TABS Take 20 mg by mouth every 8 (eight) hours.      No results found for this or any previous visit (from the past 48 hour(s)). No results found.  Review of Systems  Constitutional: Negative.   HENT: Negative.   Eyes: Negative.   Respiratory: Negative.   Cardiovascular: Negative.   Gastrointestinal: Negative.   Genitourinary: Negative.   Musculoskeletal: Positive for back pain.  Skin: Negative.   Neurological: Positive for tingling and focal weakness.  Endo/Heme/Allergies: Negative.   Psychiatric/Behavioral: The patient is nervous/anxious.     Blood pressure (!) 129/93, pulse 79, temperature 98.3 F (36.8 C), temperature source Oral, resp. rate 18, height 6' (1.829 m), weight 82.5 kg, SpO2 97 %. Physical Exam  Constitutional: He is oriented to person, place, and time. He appears well-developed and well-nourished.  HENT:  Head: Normocephalic and atraumatic.  Eyes: Pupils are equal, round, and reactive to light. Conjunctivae and EOM are normal.  Neck: Normal range  of motion. Neck supple.  GI: Soft. Bowel sounds are normal.  Musculoskeletal:  No tenderness to palpation and percussion however straight leg raising is positive at 30 degrees in either lower extremity.  Patrick's maneuver is negative  Neurological: He is alert and oriented to person, place, and time.  Mild weakness in the quadricep on the right 4 out of 5 absent patellar reflex.  Trace  reflex in the left patellae.  Straight leg raising is positive at 30 degrees in either lower extremity.  Patrick's maneuver is negative  Skin: Skin is warm and dry.  Psychiatric: He has a normal mood and affect. His behavior is normal. Judgment and thought content normal.     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  Plan: Anterolateral decompression L3-4 with interbody spacer and lateral plate fixation.  Stefani DamaHenry J Buckley Bradly, MD 04/04/2018, 7:15 AM

## 2018-04-04 NOTE — Anesthesia Postprocedure Evaluation (Signed)
Anesthesia Post Note  Patient: Darrell Ramirez  Procedure(s) Performed: Lumbar three-four Anterolateral decompression/fusion with lateral plate fixation (N/A Spine Lumbar)     Patient location during evaluation: PACU Anesthesia Type: General Level of consciousness: awake and alert Pain management: pain level controlled Vital Signs Assessment: post-procedure vital signs reviewed and stable Respiratory status: spontaneous breathing, nonlabored ventilation, respiratory function stable and patient connected to nasal cannula oxygen Cardiovascular status: blood pressure returned to baseline and stable Postop Assessment: no apparent nausea or vomiting Anesthetic complications: no    Last Vitals:  Vitals:   04/04/18 1030 04/04/18 1049  BP: 115/75 128/82  Pulse: 91 80  Resp: 13 19  Temp: (!) 36.1 C   SpO2: 99% 98%    Last Pain:  Vitals:   04/04/18 1343  TempSrc:   PainSc: 10-Worst pain ever                 Wolf Boulay

## 2018-04-04 NOTE — Anesthesia Procedure Notes (Signed)
Procedure Name: Intubation Date/Time: 04/04/2018 7:42 AM Performed by: Mariea Clonts, CRNA Pre-anesthesia Checklist: Patient identified, Emergency Drugs available, Suction available and Patient being monitored Patient Re-evaluated:Patient Re-evaluated prior to induction Oxygen Delivery Method: Circle System Utilized Preoxygenation: Pre-oxygenation with 100% oxygen Induction Type: IV induction Ventilation: Mask ventilation without difficulty Laryngoscope Size: Mac and 4 Grade View: Grade II Tube type: Oral Tube size: 7.5 mm Number of attempts: 1 Airway Equipment and Method: Stylet and Oral airway Placement Confirmation: ETT inserted through vocal cords under direct vision,  positive ETCO2 and breath sounds checked- equal and bilateral Tube secured with: Tape Dental Injury: Teeth and Oropharynx as per pre-operative assessment

## 2018-04-04 NOTE — Anesthesia Preprocedure Evaluation (Signed)
Anesthesia Evaluation  Patient identified by MRN, date of birth, ID band Patient awake    Reviewed: Allergy & Precautions, NPO status , Patient's Chart, lab work & pertinent test results  History of Anesthesia Complications Negative for: history of anesthetic complications  Airway Mallampati: II  TM Distance: >3 FB Neck ROM: Full    Dental  (+) Teeth Intact,    Pulmonary Current Smoker,    breath sounds clear to auscultation       Cardiovascular negative cardio ROS   Rhythm:Regular     Neuro/Psych  Neuromuscular disease    GI/Hepatic Neg liver ROS, GERD  Controlled,  Endo/Other  negative endocrine ROS  Renal/GU negative Renal ROS  negative genitourinary   Musculoskeletal  (+) Arthritis ,   Abdominal   Peds negative pediatric ROS (+)  Hematology negative hematology ROS (+)   Anesthesia Other Findings   Reproductive/Obstetrics                             Anesthesia Physical Anesthesia Plan  ASA: II  Anesthesia Plan: General   Post-op Pain Management:    Induction: Intravenous  PONV Risk Score and Plan: 1 and Ondansetron and Dexamethasone  Airway Management Planned: Oral ETT  Additional Equipment: None  Intra-op Plan:   Post-operative Plan: Extubation in OR  Informed Consent: I have reviewed the patients History and Physical, chart, labs and discussed the procedure including the risks, benefits and alternatives for the proposed anesthesia with the patient or authorized representative who has indicated his/her understanding and acceptance.   Dental advisory given  Plan Discussed with: CRNA and Surgeon  Anesthesia Plan Comments:         Anesthesia Quick Evaluation

## 2018-04-05 ENCOUNTER — Encounter (HOSPITAL_COMMUNITY): Payer: Self-pay | Admitting: Neurological Surgery

## 2018-04-05 MED ORDER — KETOROLAC TROMETHAMINE 15 MG/ML IJ SOLN
15.0000 mg | Freq: Four times a day (QID) | INTRAMUSCULAR | Status: DC | PRN
  Filled 2018-04-05: qty 1

## 2018-04-05 MED ORDER — DIAZEPAM 5 MG PO TABS: 5.0000 mg | ORAL_TABLET | Freq: Three times a day (TID) | ORAL | 0 refills | Status: AC | PRN

## 2018-04-05 MED ORDER — OXYCODONE HCL 10 MG PO TABS: 20.0000 mg | ORAL_TABLET | Freq: Four times a day (QID) | ORAL | 0 refills | Status: AC | PRN

## 2018-04-05 MED ORDER — KETOROLAC TROMETHAMINE 15 MG/ML IJ SOLN
15.0000 mg | Freq: Four times a day (QID) | INTRAMUSCULAR | Status: DC
  Administered 2018-04-05 (×2): 15 mg via INTRAVENOUS
  Filled 2018-04-05: qty 1

## 2018-04-05 NOTE — Discharge Instructions (Signed)
Wound Care Leave incision open to air. You may shower. Do not scrub directly on incision.  Do not put any creams, lotions, or ointments on incision. Activity Walk each and every day, increasing distance each day. No lifting greater than 5 lbs.  Avoid excessive neck motion. No driving for 2 weeks; may ride as a passenger locally. Diet Resume your normal diet.  Return to Work Will be discussed at you follow up appointment. Call Your Doctor If Any of These Occur Redness, drainage, or swelling at the wound.  Temperature greater than 101 degrees. Severe pain not relieved by pain medication. Increased difficulty swallowing. Incision starts to come apart. Follow Up Appt Call today for appointment in 2-4 weeks ([phone]) or for problems.  If you have any hardware placed in your spine, you will need an x-Chanson before your appointment. 

## 2018-04-05 NOTE — Evaluation (Signed)
Physical Therapy Evaluation and Discharge Patient Details Name: Darrell Ramirez MRN: [id number] DOB: Jun 01, 1965 Today's Date: 04/05/2018   History of Present Illness  Pt is a 52 y/o male s/p L3-4 anterolateral dcompression and fusion with lateral plate fixation due to worsening back and LE pain.  PMH: arthritis, ACDF, lumbar fusion.   Clinical Impression  Patient evaluated by Physical Therapy with no further acute PT needs identified. All education has been completed and the patient has no further questions. At the time of PT eval pt was able to perform transfers and ambulation with gross modified independence and no AD. Pt was educated on stair negotiation, car transfer, brace application/wearing schedule, and general safety with activity progression. See below for any follow-up Physical Therapy or equipment needs. PT is signing off. Thank you for this referral.     Follow Up Recommendations No PT follow up;Supervision for mobility/OOB    Equipment Recommendations  None recommended by PT    Recommendations for Other Services       Precautions / Restrictions Precautions Precautions: Back Precaution Booklet Issued: Yes (comment) Precaution Comments: Reviewed handout in detail and pt was cued for precautions during functional mobility.  Required Braces or Orthoses: Spinal Brace Spinal Brace: Lumbar corset Restrictions Weight Bearing Restrictions: No      Mobility  Bed Mobility Overal bed mobility: Needs Assistance Bed Mobility: Rolling;Sidelying to Sit Rolling: Modified independent (Device/Increase time) Sidelying to sit: Supervision     Sit to sidelying: Supervision General bed mobility comments: supervision to ensure log roll technique  Transfers Overall transfer level: Modified independent Equipment used: None Transfers: Sit to/from Stand Sit to Stand: Supervision         General transfer comment: Pt demonstrated proper hand placement on seated surface for safety.    Ambulation/Gait Ambulation/Gait assistance: Modified independent (Device/Increase time) Gait Distance (Feet): 400 Feet Assistive device: None Gait Pattern/deviations: Step-through pattern;Decreased stride length Gait velocity: Decreased Gait velocity interpretation: 1.31 - 2.62 ft/sec, indicative of limited community ambulator General Gait Details: Slightly slowed but generally steady. No overt LOB noted. Pt was cued for improved posture and was able to maintain well throughout gait training.   Stairs         General stair comments: Pt declined stair training however he was verbally educated on safe negotiation.   Wheelchair Mobility    Modified Rankin (Stroke Patients Only)       Balance Overall balance assessment: No apparent balance deficits (not formally assessed)                                           Pertinent Vitals/Pain Pain Assessment: 0-10 Pain Score: 8  Pain Location: back incision, L leg Pain Descriptors / Indicators: Discomfort;Grimacing;Operative site guarding;Sore Pain Intervention(s): Monitored during session;Repositioned    Home Living Family/patient expects to be discharged to:: Private residence Living Arrangements: Non-relatives/Friends Available Help at Discharge: Friend(s);Available PRN/intermittently(roomates) Type of Home: House Home Access: Stairs to enter   Entrance Stairs-Number of Steps: 2 Home Layout: Two level;Able to live on main level with bedroom/bathroom Home Equipment: Walker - 2 wheels      Prior Function Level of Independence: Independent         Comments: patient independent with ADLs and mobility, +driving, not working      Hand Dominance   Dominant Hand: Right    Extremity/Trunk Assessment   Upper Extremity Assessment Upper  Extremity Assessment: Overall WFL for tasks assessed    Lower Extremity Assessment Lower Extremity Assessment: Overall WFL for tasks assessed    Cervical / Trunk  Assessment Cervical / Trunk Assessment: Other exceptions Cervical / Trunk Exceptions: s/p lumber sx   Communication   Communication: No difficulties  Cognition Arousal/Alertness: Awake/alert Behavior During Therapy: WFL for tasks assessed/performed Overall Cognitive Status: Within Functional Limits for tasks assessed                                        General Comments      Exercises     Assessment/Plan    PT Assessment Patent does not need any further PT services  PT Problem List         PT Treatment Interventions      PT Goals (Current goals can be found in the Care Plan section)  Acute Rehab PT Goals Patient Stated Goal: to have less pain PT Goal Formulation: All assessment and education complete, DC therapy    Frequency     Barriers to discharge        Co-evaluation               AM-PAC PT "6 Clicks" Mobility  Outcome Measure Help needed turning from your back to your side while in a flat bed without using bedrails?: None Help needed moving from lying on your back to sitting on the side of a flat bed without using bedrails?: None Help needed moving to and from a bed to a chair (including a wheelchair)?: None Help needed standing up from a chair using your arms (e.g., wheelchair or bedside chair)?: None Help needed to walk in hospital room?: None Help needed climbing 3-5 steps with a railing? : None 6 Click Score: 24    End of Session Equipment Utilized During Treatment: Gait belt Activity Tolerance: Patient tolerated treatment well Patient left: in bed;with call bell/phone within reach Nurse Communication: Mobility status PT Visit Diagnosis: Pain Pain - part of body: (back)    Time: 6644-0347 PT Time Calculation (min) (ACUTE ONLY): 12 min   Charges:   PT Evaluation $PT Eval Low Complexity: 1 Low          Darrell Ramirez, PT, DPT Acute Rehabilitation Services Pager: [phone] Office: [phone]   Darrell Ramirez 04/05/2018, 9:01 AM

## 2018-04-05 NOTE — Evaluation (Addendum)
Occupational Therapy Evaluation and Discharge Patient Details Name: Darrell Ramirez MRN: [id number] DOB: [birth year] Today's Date: 04/05/2018    History of Present Illness Pt is a 52 y/o male s/p L3-4 anterolateral dcompression and fusion with lateral plate fixation due to worsening back and LE pain.  PMH: arthritis, ACDF, lumbar fusion.    Clinical Impression   PTA patient reports independent with ADLs, IADLs and mobility, limited by pain.  Patient currently admitted for above and limited by pain, impaired activity tolerance and precautions.  Educated on back precautions, brace mgmt and wear schedule, ADL compensatory techniques, safety, mobility, and recommendations. He requires min assist for LB dressing and supervision for LB bathing after education on long sponge, supervision for toilet transfers, grooming and simulated tub transfers.  Agreeable to 3:1 commode for shower chair.  He reports he has support of roommates as needed initially, and plans to purchase long sponge for bathing.  Voices understanding all all education and recommendations, all needs have been met and at this time no further OT needs have been identified. Thank you for this referral. OT signing off.     Follow Up Recommendations  No OT follow up;Supervision - Intermittent    Equipment Recommendations  3 in 1 bedside commode    Recommendations for Other Services PT consult     Precautions / Restrictions Precautions Precautions: Back Precaution Booklet Issued: Yes (comment) Precaution Comments: reviewed with patinet  Required Braces or Orthoses: Spinal Brace Spinal Brace: Lumbar corset Restrictions Weight Bearing Restrictions: No      Mobility Bed Mobility Overal bed mobility: Needs Assistance Bed Mobility: Rolling;Sidelying to Sit;Sit to Sidelying Rolling: Supervision Sidelying to sit: Supervision     Sit to sidelying: Supervision General bed mobility comments: supervision to ensure log roll  technique  Transfers Overall transfer level: Needs assistance Equipment used: None Transfers: Sit to/from Stand Sit to Stand: Supervision         General transfer comment: supervision for safety    Balance Overall balance assessment: Mild deficits observed, not formally tested                                         ADL either performed or assessed with clinical judgement   ADL Overall ADL's : Needs assistance/impaired     Grooming: Supervision/safety;Standing Grooming Details (indicate cue type and reason): vc's for techniques to adhere to precautions  Upper Body Bathing: Sitting;Supervision/ safety   Lower Body Bathing: Supervison/ safety;With adaptive equipment;Sitting/lateral leans;Cueing for back precautions;Cueing for compensatory techniques Lower Body Bathing Details (indicate cue type and reason): reviewed compensatory techniques with bathing seated and safety, increased difficulty with L LE figure 4 technique and edu on long sponge Upper Body Dressing : Supervision/safety;Sitting   Lower Body Dressing: Minimal assistance;Cueing for compensatory techniques;Cueing for back precautions;Sit to/from stand Lower Body Dressing Details (indicate cue type and reason): reviewed precautions and compensatory techniques, min assist for L sock but will have assist or use slip on shoes  Toilet Transfer: Supervision/safety;Ambulation Toilet Transfer Details (indicate cue type and reason): simulated in room Toileting- Clothing Manipulation and Hygiene: Supervision/safety;Sit to/from stand   Tub/ Shower Transfer: Tub transfer;Supervision/safety;Cueing for safety;Adhering to back precautions;Ambulation;3 in 1 Tub/Shower Transfer Details (indicate cue type and reason): reviewed safety using 3:1 seated, side stepping over simulated ledge with supervision  Functional mobility during ADLs: Supervision/safety General ADL Comments: pateint educated on back precautions,  safety, compensatory  technqiues and recommendations      Vision   Vision Assessment?: No apparent visual deficits     Perception     Praxis      Pertinent Vitals/Pain Pain Assessment: 0-10 Pain Score: 8  Pain Location: back incision, L leg Pain Descriptors / Indicators: Discomfort;Grimacing;Operative site guarding;Sore Pain Intervention(s): Limited activity within patient's tolerance;Monitored during session;Repositioned     Hand Dominance     Extremity/Trunk Assessment Upper Extremity Assessment Upper Extremity Assessment: Overall WFL for tasks assessed   Lower Extremity Assessment Lower Extremity Assessment: Defer to PT evaluation   Cervical / Trunk Assessment Cervical / Trunk Assessment: Other exceptions Cervical / Trunk Exceptions: s/p lumber sx    Communication Communication Communication: No difficulties   Cognition Arousal/Alertness: Awake/alert Behavior During Therapy: WFL for tasks assessed/performed Overall Cognitive Status: Within Functional Limits for tasks assessed                                     General Comments       Exercises     Shoulder Instructions      Home Living Family/patient expects to be discharged to:: Private residence Living Arrangements: Non-relatives/Friends Available Help at Discharge: Friend(s);Available PRN/intermittently(roomates) Type of Home: House Home Access: Stairs to enter [employer] of Steps: a few   Home Layout: Two level;Able to live on main level with bedroom/bathroom     Bathroom Shower/Tub: Teacher, early years/pre: Standard     Home Equipment: Walker - 2 wheels          Prior Functioning/Environment Level of Independence: Independent        Comments: patient independent with ADLs and mobility, +driving, not working         OT Problem List: Decreased activity tolerance;Impaired balance (sitting and/or standing);Decreased safety awareness;Decreased  knowledge of use of DME or AE;Decreased knowledge of precautions;Pain      OT Treatment/Interventions:      OT Goals(Current goals can be found in the care plan section) Acute Rehab OT Goals Patient Stated Goal: to have less pain OT Goal Formulation: With patient  OT Frequency:     Barriers to D/C:            Co-evaluation              AM-PAC OT "6 Clicks" Daily Activity     Outcome Measure Help from another person eating meals?: None Help from another person taking care of personal grooming?: None Help from another person toileting, which includes using toliet, bedpan, or urinal?: None Help from another person bathing (including washing, rinsing, drying)?: None Help from another person to put on and taking off regular upper body clothing?: None Help from another person to put on and taking off regular lower body clothing?: A Little 6 Click Score: 23   End of Session Equipment Utilized During Treatment: Back brace Nurse Communication: Mobility status  Activity Tolerance: Patient tolerated treatment well Patient left: in bed;with call bell/phone within reach  OT Visit Diagnosis: Pain Pain - Right/Left: Left Pain - part of body: (back)                Time: 0093-8182 OT Time Calculation (min): 15 min Charges:  OT General Charges $OT Visit: 1 Visit OT Evaluation $OT Eval Low Complexity: 1 Low  Delight Stare, OT Acute Rehabilitation Services Pager [phone] Office [phone]   Delight Stare 04/05/2018, 8:17  AM

## 2018-04-05 NOTE — Progress Notes (Signed)
Patient ID: Darrell Ramirez, male   DOB: 10/23/1965, 52 y.o.   MRN: [id number] Vital signs are stable Patient feels his right thigh pain is much decreased Ambulating bowels removed and he feels ready to go home he is concerned about his pain medication management and will deal with this for now

## 2018-04-05 NOTE — Progress Notes (Signed)
Patient is discharged from room 3C02 at this time. Alert and in stable condition. IV site d/c'd and instructions read to patient and parents with understanding verbalized. Left unit via wheelchair with all belongings at side.

## 2018-04-05 NOTE — Discharge Summary (Signed)
Physician Discharge Summary  Patient ID: Jackquline BerlinRay E Crossman MRN: [id number] DOB/AGE: [birth year] 52 y.o.  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  Tolerated surgery well postoperative right thigh pain is decreased substantially.  Consults: None  Significant Diagnostic Studies: None  Treatments: surgery: Anterolateral decompression L3-4 with Adderall plate fixation.  Discharge Exam: Blood pressure 129/75, pulse 65, temperature 98.3 F (36.8 C), temperature source Oral, resp. rate 19, height 6' (1.829 m), weight 82.5 kg, SpO2 95 %. Incision is clean and dry motor function is intact.  Disposition: Discharge disposition: 01-Home or Self Care       Discharge Instructions    Call MD for:  redness, tenderness, or signs of infection (pain, swelling, redness, odor or green/yellow discharge around incision site)   Complete by:  As directed    Call MD for:  severe uncontrolled pain   Complete by:  As directed    Call MD for:  temperature >100.4   Complete by:  As directed    Diet - low sodium heart healthy   Complete by:  As directed    Discharge instructions   Complete by:  As directed    Okay to shower. Do not apply salves or appointments to incision. No heavy lifting with the upper extremities greater than 15 pounds. May resume driving when not requiring pain medication and patient feels comfortable with doing so.   Incentive spirometry RT   Complete by:  As directed    Increase activity slowly   Complete by:  As  directed      Allergies as of 04/05/2018      Reactions   Vicodin [hydrocodone-acetaminophen] Itching   Tylenol Pm Extra [diphenhydramine-apap (sleep)] Other (See Comments)   Insomnia      Medication List    TAKE these medications   diazepam 5 MG tablet Commonly known as:  VALIUM Take 1 tablet (5 mg total) by mouth every 8 (eight) hours as needed for muscle spasms.   diclofenac sodium 1 % Gel Commonly known as:  VOLTAREN Apply 1 application topically every other day.   indomethacin 50 MG capsule Commonly known as:  INDOCIN Take 50 mg by mouth 2 (two) times daily with a meal.   multivitamin with minerals Tabs tablet Take 1 tablet by mouth daily.   Oxycodone HCl 10 MG Tabs Take 2 tablets (20 mg total) by mouth every 6 (six) hours as needed (Severe pain). What changed:    when to take this  reasons to take this        Signed: Stefani DamaHenry J Scherrie [place] 04/05/2018, 2:57 PM

## 2019-07-13 IMAGING — CT CT CERVICAL SPINE W/ CM
3 of 4 series · 12 of 33 positions shown, 14 images · non-contrast
Comparison: none

CLINICAL DATA: Low back and leg pain. Upper thoracic pain. Numbness
in the left arm.
TECHNIQUE: Contiguous axial images were obtained through the Cervical, thoracic
and Lumbar spine after the intrathecal infusion of infusion. Coronal
and sagittal reconstructions were obtained of the axial image sets.

[Series 5: c_spine 2.0 (person_name) (person_name) · axial · 0.31mm/px · z∈[-276,-140]mm · 4 of 103 slices shown, 5 images]
[im 18/103  soft-tissue]
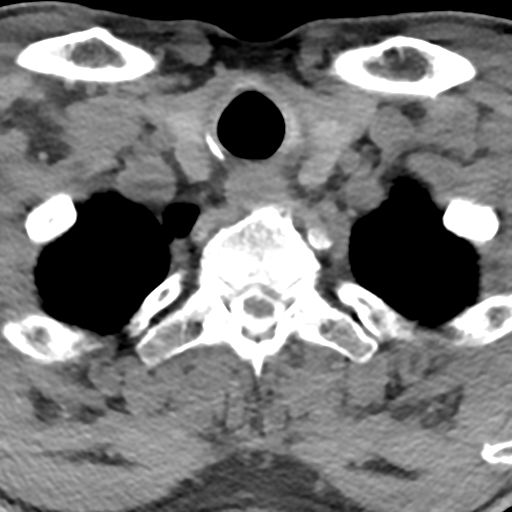
[im 18/103  bone]
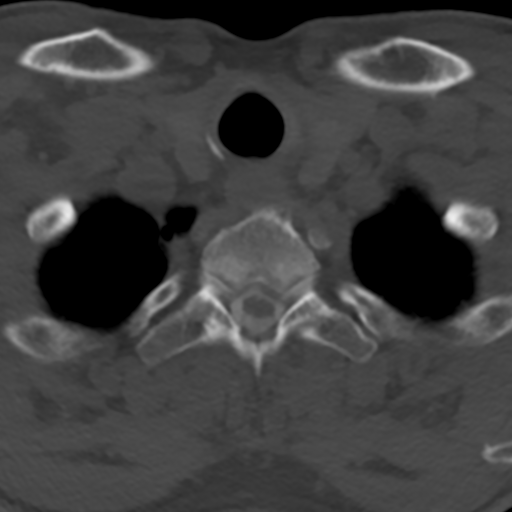
[im 35/103  bone]
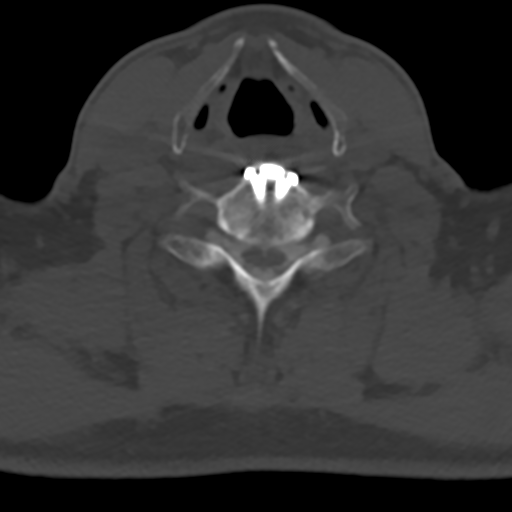
[im 69/103  bone]
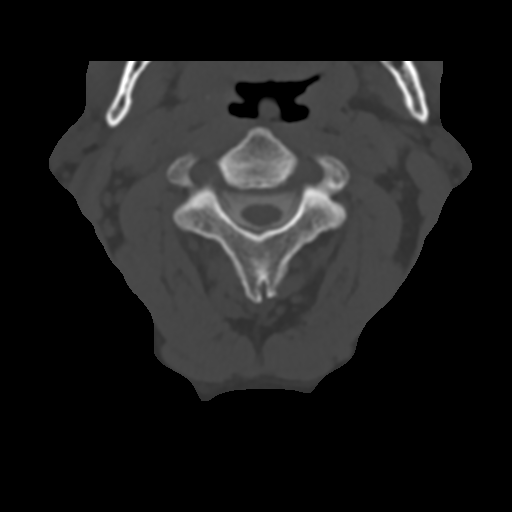
[im 86/103  bone]
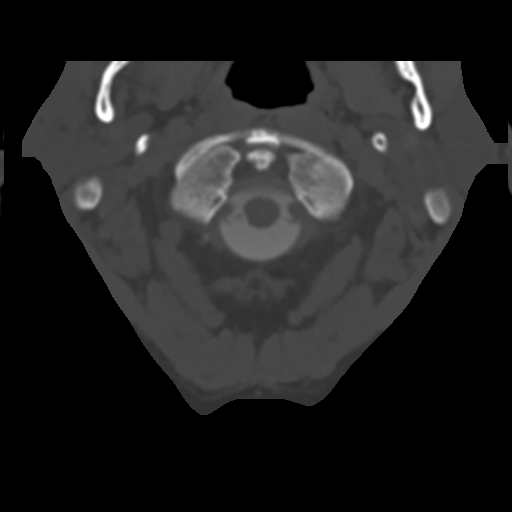

[Series 6: coronal bone · coronal · 0.30mm/px · 3 of 63 slices shown]
[im 13/63  bone]
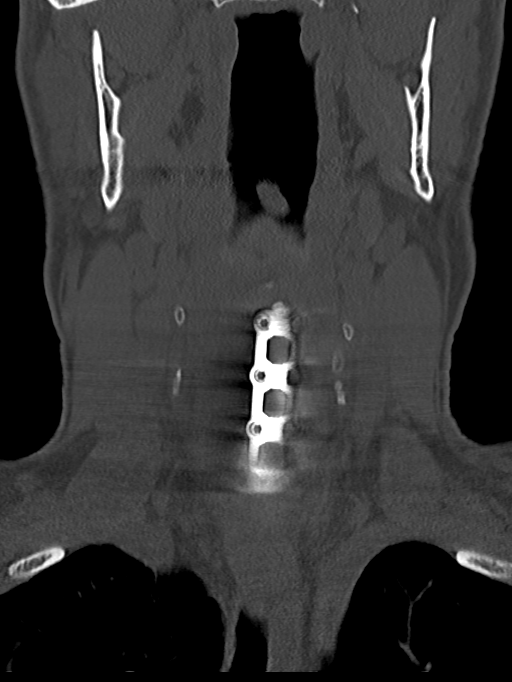
[im 25/63  bone]
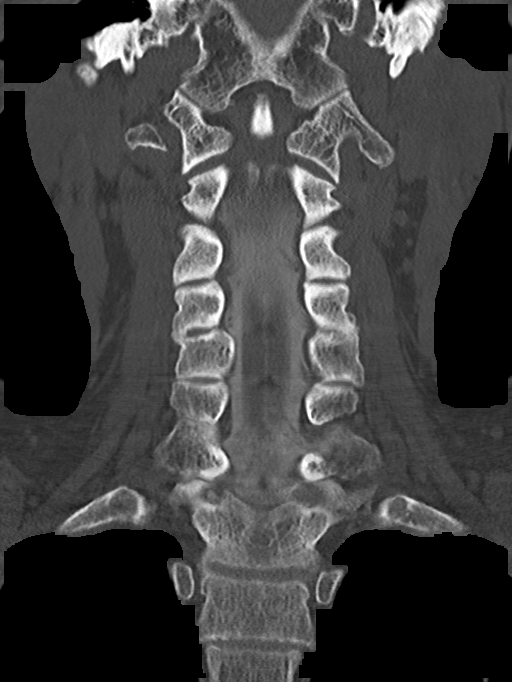
[im 38/63  bone]
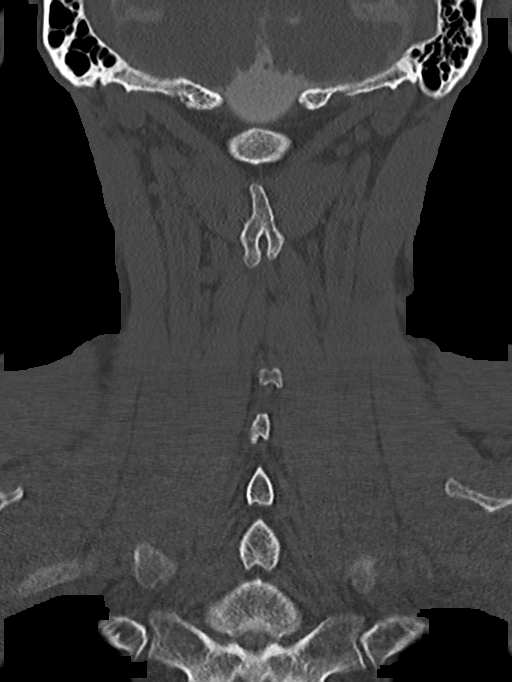

[Series 7: sagittal bone · sagittal · 0.30mm/px · 5 of 61 slices shown, 6 images]
[im 21/61  bone]
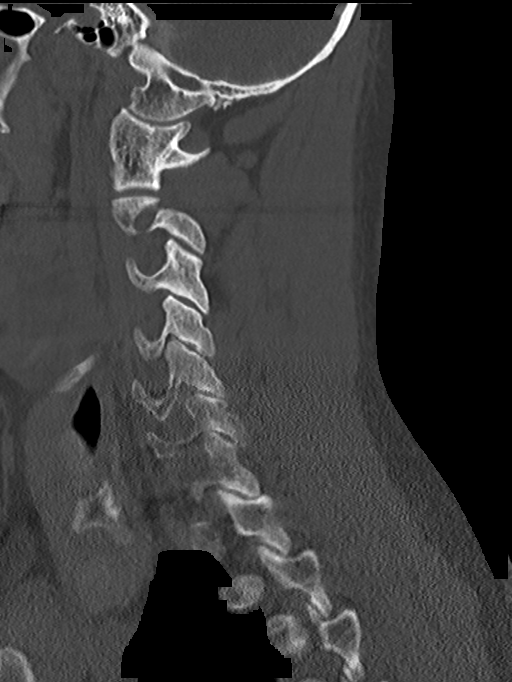
[im 26/61  bone]
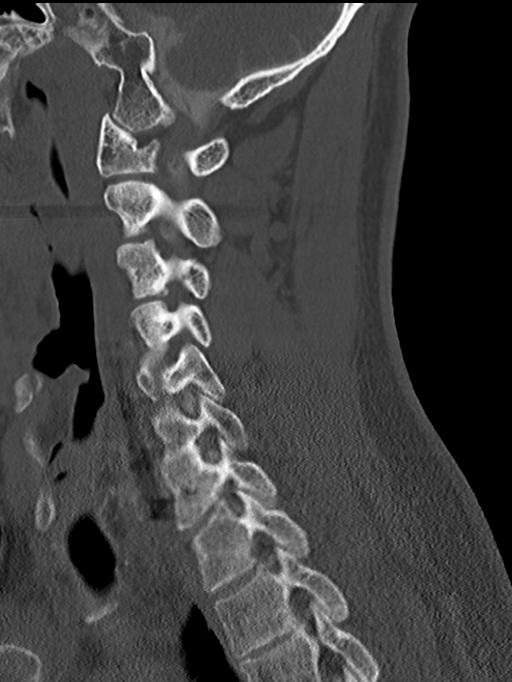
[im 31/61  soft-tissue]
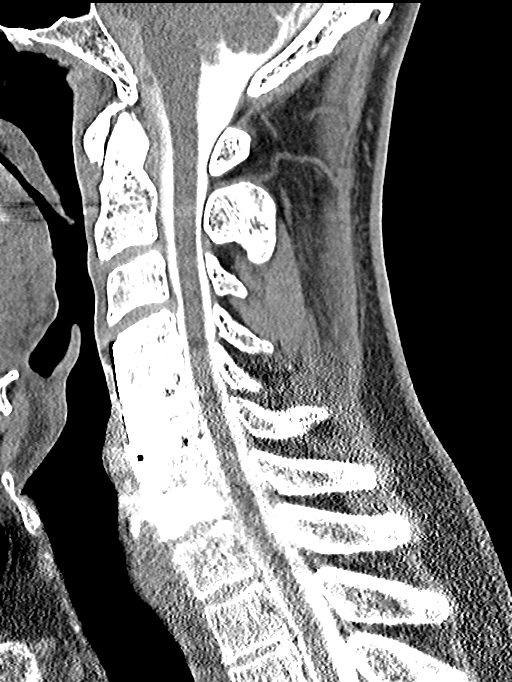
[im 31/61  bone]
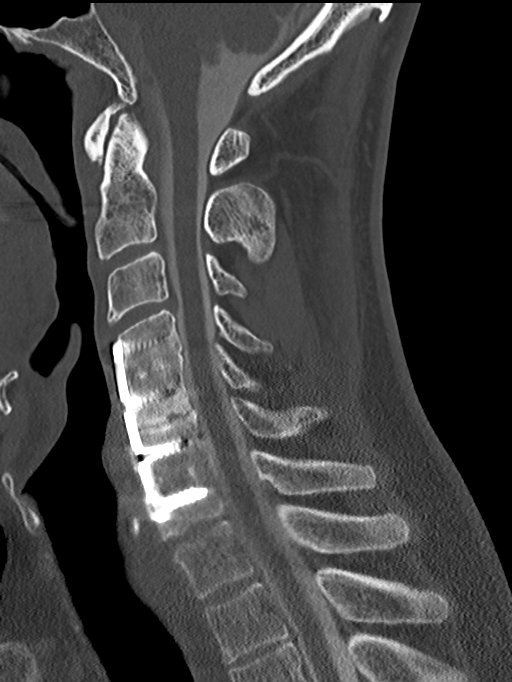
[im 36/61  bone]
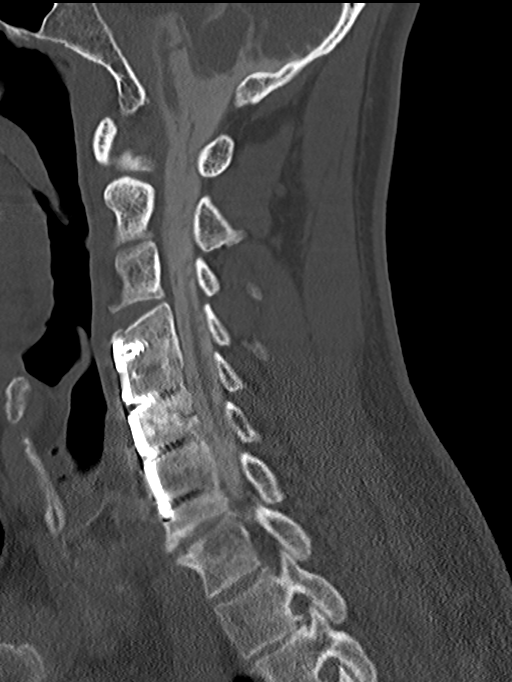
[im 41/61  bone]
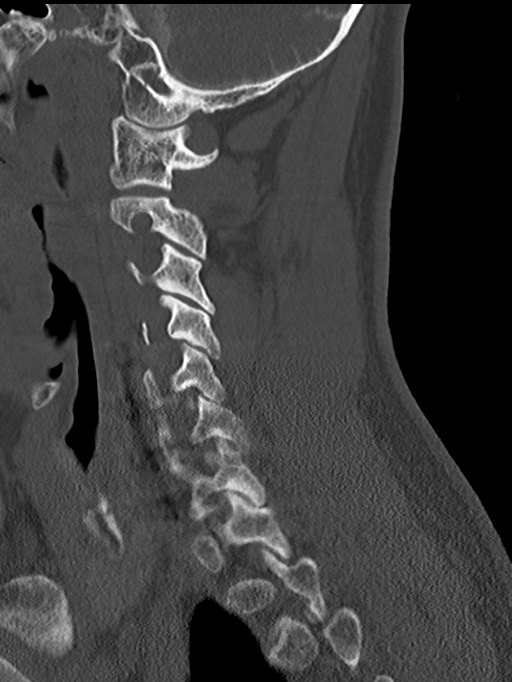

[12 of 33 positions shown; findings below may reference images not displayed]

FLUOROSCOPY TIME:  0 minutes 48 seconds. 1245.15 micro gray meter
squared

PROCEDURE:
CERVICAL, thoracic AND LUMBAR MYELOGRAM

CT CERVICAL MYELOGRAM

CT thoracic myelogram

CT LUMBAR MYELOGRAM

Lumbar puncture and contrast injection was performed by Dr. Kriptavicius.

I personally performed  acquisition of the myelogram images.
FINDINGS: CERVICAL AND LUMBAR MYELOGRAM FINDINGS:

Lumbar region: The patient has had previous discectomy and fusion
from L4 to the sacrum. There is moderate multifactorial stenosis at
the L3-4 level with anterior and posterior encroachment. Flexion
extension views do not show any significant change. There is
potential for neural compression on either side at this level. There
is wide patency in the fusion segment. No significant finding at
L1-2 or L2-3.

Thoracic region: No canal stenosis.  No extradural defect seen.

Cervical region: Previous ACDF C4 through C7. Wide patency of the
canal and foramina. No neural compressive abnormality seen. Minimal
anterior extradural defect at C3-4.

CT CERVICAL MYELOGRAM FINDINGS:

Foramen magnum is widely patent.  C1-2 and C2-3 are normal.

C3-4: Mild disc bulge and right uncovertebral hypertrophy. No
compressive canal stenosis. Mild right foraminal narrowing.

C4 through C7: Previous ACDF. Fusion is solid. No screw malposition
or loosening. Wide patency of the canal and foramina.

C7-T1: Minimal facet hypertrophy and uncovertebral hypertrophy. No
canal or foraminal stenosis.

CT THORACIC MYELOGRAM FINDINGS:

T1-2: Bulging of the disc towards the left. Mild narrowing of the
ventral subarachnoid space on the left but no apparent neural
compression. Foramina appear sufficiently patent.

T2-3: Spondylosis with endplate osteophytes and bulging of the disc.
No compressive canal stenosis. Left foraminal encroachment by
osteophytes that could affect the left T2 nerve.

T3-4: Endplate osteophytes. No canal stenosis. Mild bony foraminal
encroachment on the left, not likely compressive.

T4-5: Normal interspace.

T5-6: Shallow right posterolateral disc herniation narrows the
ventral subarachnoid space but does not affect the cord or show
foraminal compromise.

T6-7: Shallow left-sided disc protrusion narrows the ventral
subarachnoid space but does not affect the cord or show foraminal
extension.

T7-8: Minimal left-sided disc bulge and osteophytes but no neural
compression.

T8-9: Chronic left-sided disc protrusion and osteophytes. No central
canal stenosis. Mild foraminal encroachment on the left.

T9-10: Chronic left-sided disc protrusion with calcification. No
central canal stenosis. Mild left foraminal narrowing.

T10-11: Normal interspace.

T11-12: Normal interspace.

T12-L1: Normal interspace.

CT LUMBAR MYELOGRAM FINDINGS:

No abnormality at L1-2 or L2-3.

L3-4: Endplate osteophytes and broad-based herniation of the disc.
Facet and ligamentous hypertrophy. Spinal stenosis at this level
with effacement of the subarachnoid space and crowding of the nerve
roots. Considerable potential for neural compression at this level.
There may also be some focal disc material in the left foraminal to
extraforaminal region.

L4 to sacrum: Distant posterior decompression, diskectomy and
fusion. Solid union with wide patency of the canal and foramina. No
hardware complication.

Chronic sacroiliac ankylosis.
IMPRESSION: Cervical region: Good appearance in the fusion segment from C4
through C7. C3-4 disc bulge and right uncovertebral hypertrophy with
mild right foraminal stenosis.

Thoracic region: Multilevel chronic disc degeneration in the
thoracic region as outlined above. No sign of acute disc herniation.
Foraminal encroachment would have some potential for neural
compression particularly on the left at T2-3. Other lesser findings
on the left at T3-4, T7-8, T8-9 and T9-10.

Lumbar region: Severe multifactorial stenosis at L3-4 due to disc
herniation and facet and ligamentous hypertrophy. There may also be
some left extraforaminal disc material. Good appearance in the
fusion segment from L4 to the sacrum.

## 2019-07-13 IMAGING — CT CT T SPINE W/ CM
2 of 6 series · 9 of 33 positions shown, 11 images · non-contrast
Comparison: none

CLINICAL DATA: Low back and leg pain. Upper thoracic pain. Numbness
in the left arm.
TECHNIQUE: Contiguous axial images were obtained through the Cervical, thoracic
and Lumbar spine after the intrathecal infusion of infusion. Coronal
and sagittal reconstructions were obtained of the axial image sets.

[Series 4: t-spine 2.0 st · axial · 0.38mm/px · z∈[-508,-298]mm · 4 of 176 slices shown, 5 images]
[im 36/176  soft-tissue]
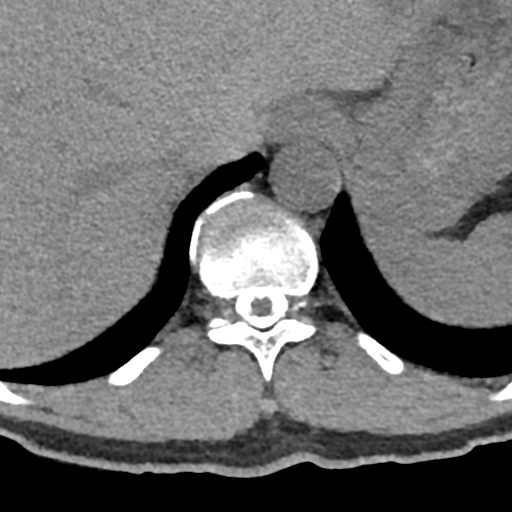
[im 36/176  bone]
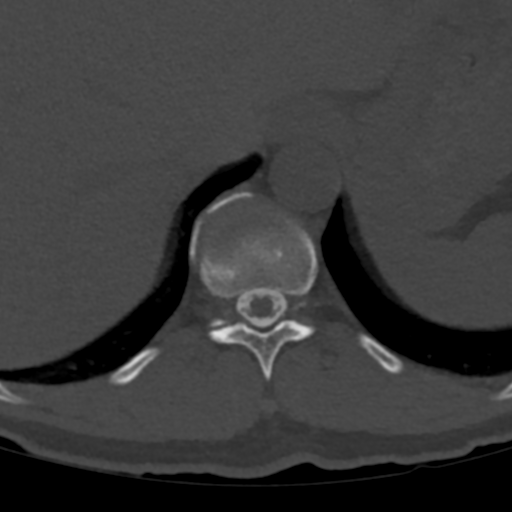
[im 71/176  bone]
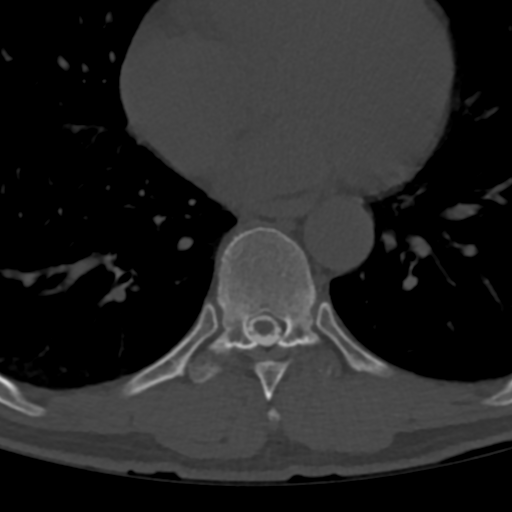
[im 106/176  bone]
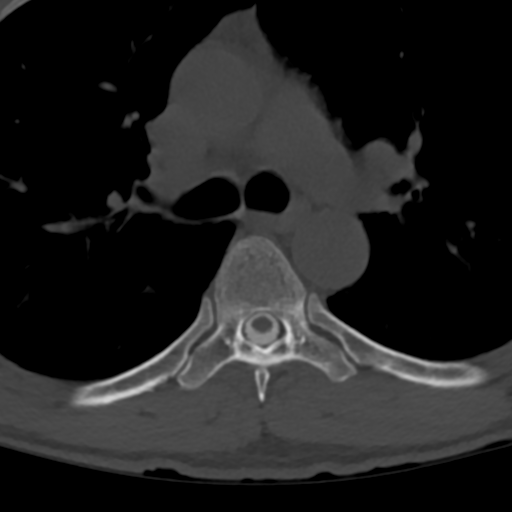
[im 141/176  bone]
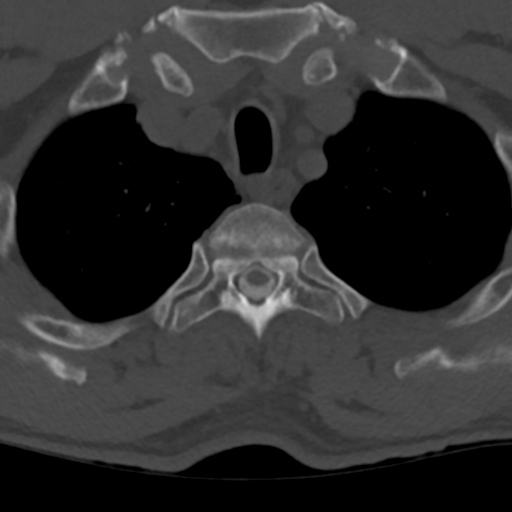

[Series 10: sagittal st · sagittal · 0.35mm/px · 5 of 65 slices shown, 6 images]
[im 22/65  bone]
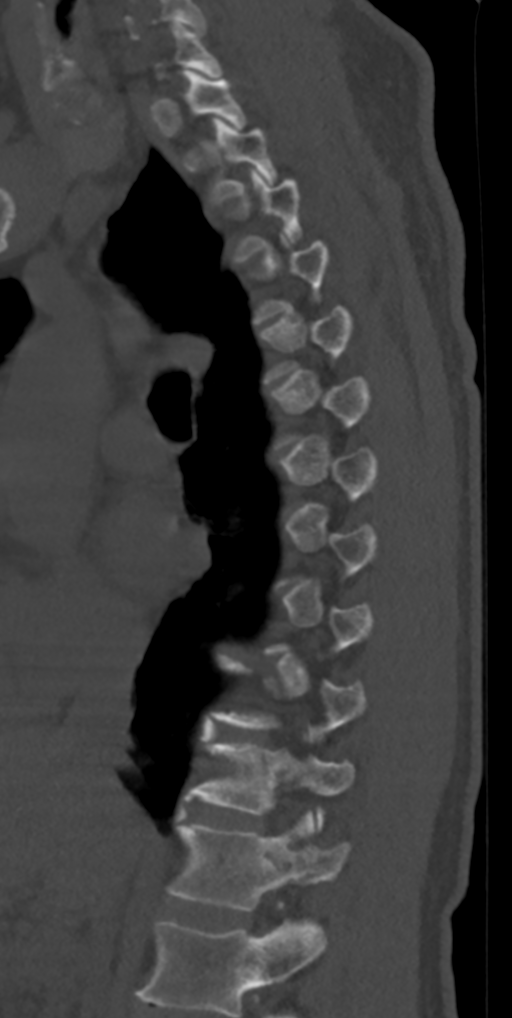
[im 27/65  bone]
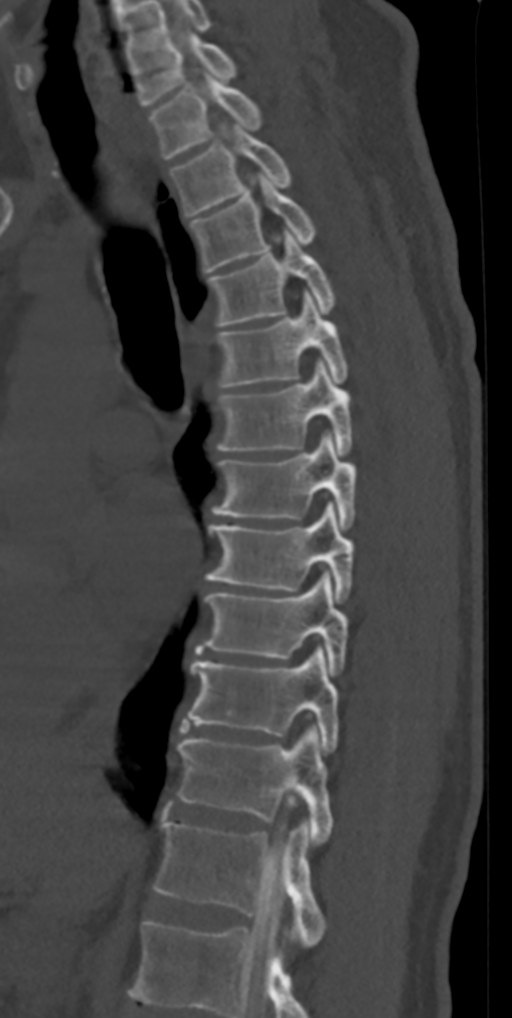
[im 33/65  soft-tissue]
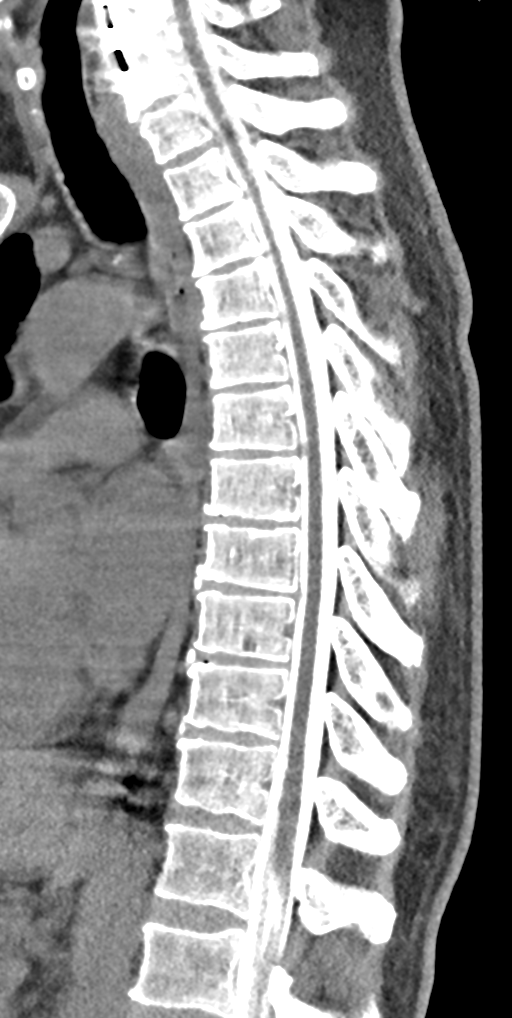
[im 33/65  bone]
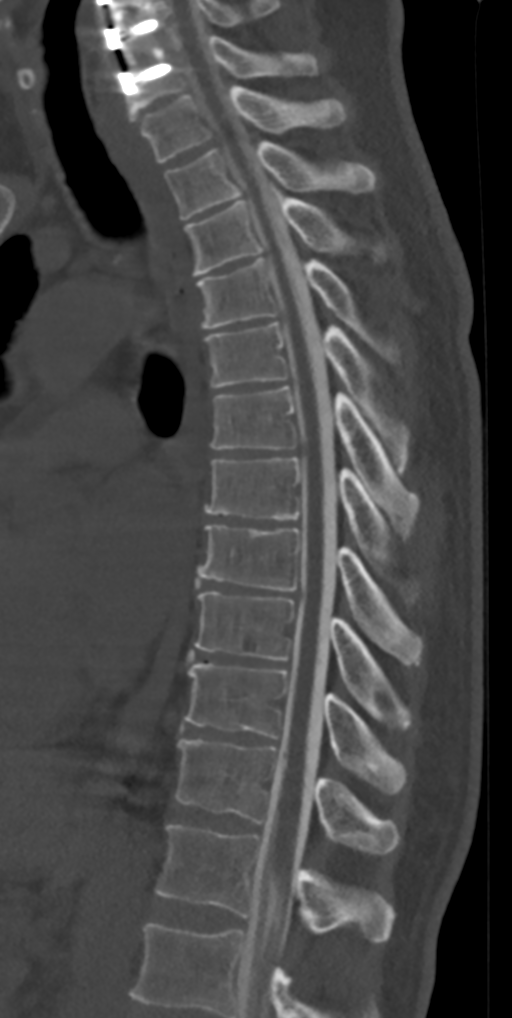
[im 38/65  bone]
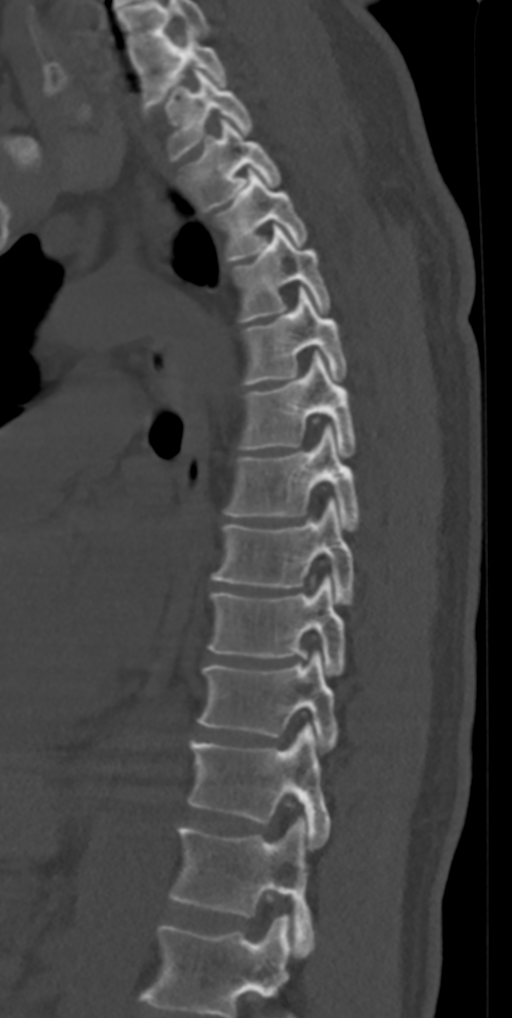
[im 43/65  bone]
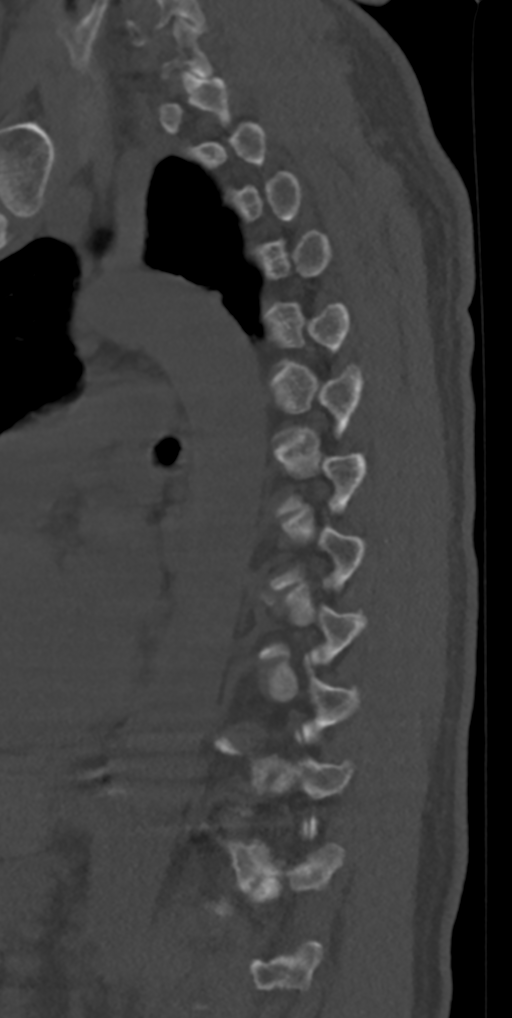

[9 of 33 positions shown; findings below may reference images not displayed]

FLUOROSCOPY TIME:  0 minutes 48 seconds. 1245.15 micro gray meter
squared

PROCEDURE:
CERVICAL, thoracic AND LUMBAR MYELOGRAM

CT CERVICAL MYELOGRAM

CT thoracic myelogram

CT LUMBAR MYELOGRAM

Lumbar puncture and contrast injection was performed by Dr. Kriptavicius.

I personally performed  acquisition of the myelogram images.
FINDINGS: CERVICAL AND LUMBAR MYELOGRAM FINDINGS:

Lumbar region: The patient has had previous discectomy and fusion
from L4 to the sacrum. There is moderate multifactorial stenosis at
the L3-4 level with anterior and posterior encroachment. Flexion
extension views do not show any significant change. There is
potential for neural compression on either side at this level. There
is wide patency in the fusion segment. No significant finding at
L1-2 or L2-3.

Thoracic region: No canal stenosis.  No extradural defect seen.

Cervical region: Previous ACDF C4 through C7. Wide patency of the
canal and foramina. No neural compressive abnormality seen. Minimal
anterior extradural defect at C3-4.

CT CERVICAL MYELOGRAM FINDINGS:

Foramen magnum is widely patent.  C1-2 and C2-3 are normal.

C3-4: Mild disc bulge and right uncovertebral hypertrophy. No
compressive canal stenosis. Mild right foraminal narrowing.

C4 through C7: Previous ACDF. Fusion is solid. No screw malposition
or loosening. Wide patency of the canal and foramina.

C7-T1: Minimal facet hypertrophy and uncovertebral hypertrophy. No
canal or foraminal stenosis.

CT THORACIC MYELOGRAM FINDINGS:

T1-2: Bulging of the disc towards the left. Mild narrowing of the
ventral subarachnoid space on the left but no apparent neural
compression. Foramina appear sufficiently patent.

T2-3: Spondylosis with endplate osteophytes and bulging of the disc.
No compressive canal stenosis. Left foraminal encroachment by
osteophytes that could affect the left T2 nerve.

T3-4: Endplate osteophytes. No canal stenosis. Mild bony foraminal
encroachment on the left, not likely compressive.

T4-5: Normal interspace.

T5-6: Shallow right posterolateral disc herniation narrows the
ventral subarachnoid space but does not affect the cord or show
foraminal compromise.

T6-7: Shallow left-sided disc protrusion narrows the ventral
subarachnoid space but does not affect the cord or show foraminal
extension.

T7-8: Minimal left-sided disc bulge and osteophytes but no neural
compression.

T8-9: Chronic left-sided disc protrusion and osteophytes. No central
canal stenosis. Mild foraminal encroachment on the left.

T9-10: Chronic left-sided disc protrusion with calcification. No
central canal stenosis. Mild left foraminal narrowing.

T10-11: Normal interspace.

T11-12: Normal interspace.

T12-L1: Normal interspace.

CT LUMBAR MYELOGRAM FINDINGS:

No abnormality at L1-2 or L2-3.

L3-4: Endplate osteophytes and broad-based herniation of the disc.
Facet and ligamentous hypertrophy. Spinal stenosis at this level
with effacement of the subarachnoid space and crowding of the nerve
roots. Considerable potential for neural compression at this level.
There may also be some focal disc material in the left foraminal to
extraforaminal region.

L4 to sacrum: Distant posterior decompression, diskectomy and
fusion. Solid union with wide patency of the canal and foramina. No
hardware complication.

Chronic sacroiliac ankylosis.
IMPRESSION: Cervical region: Good appearance in the fusion segment from C4
through C7. C3-4 disc bulge and right uncovertebral hypertrophy with
mild right foraminal stenosis.

Thoracic region: Multilevel chronic disc degeneration in the
thoracic region as outlined above. No sign of acute disc herniation.
Foraminal encroachment would have some potential for neural
compression particularly on the left at T2-3. Other lesser findings
on the left at T3-4, T7-8, T8-9 and T9-10.

Lumbar region: Severe multifactorial stenosis at L3-4 due to disc
herniation and facet and ligamentous hypertrophy. There may also be
some left extraforaminal disc material. Good appearance in the
fusion segment from L4 to the sacrum.

## 2019-07-13 IMAGING — CT CT L SPINE W/ CM
3 of 5 series · 10 of 33 positions shown, 12 images · non-contrast
Comparison: none

CLINICAL DATA: Low back and leg pain. Upper thoracic pain. Numbness
in the left arm.
TECHNIQUE: Contiguous axial images were obtained through the Cervical, thoracic
and Lumbar spine after the intrathecal infusion of infusion. Coronal
and sagittal reconstructions were obtained of the axial image sets.

[Series 5: l spine 2.0 (person_name) (person_name) · axial · 0.38mm/px · z∈[-690,-586]mm · 2 of 157 slices shown, 3 images]
[im 53/157  soft-tissue]
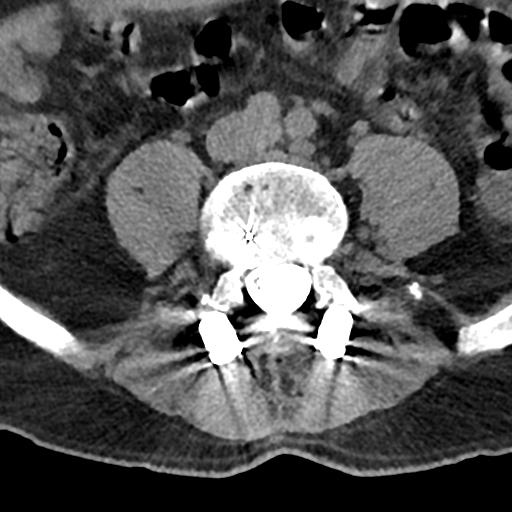
[im 53/157  bone]
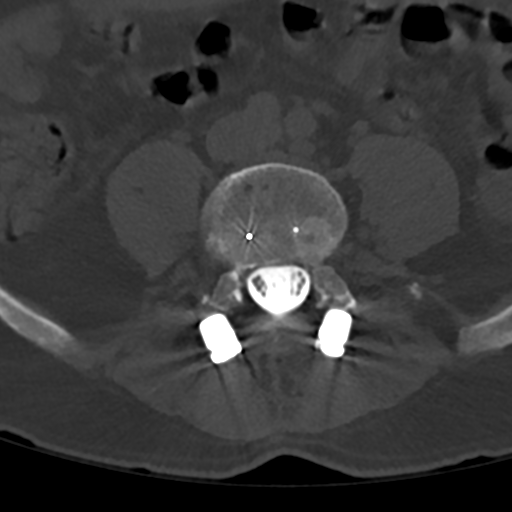
[im 105/157  bone]
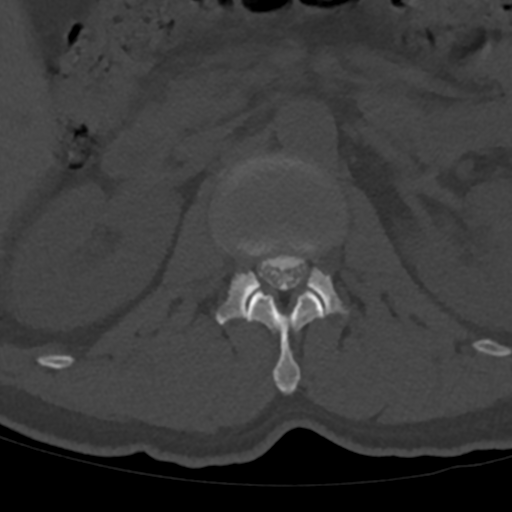

[Series 9: sagittal st · sagittal · 0.38mm/px · 5 of 71 slices shown, 6 images]
[im 24/71  bone]
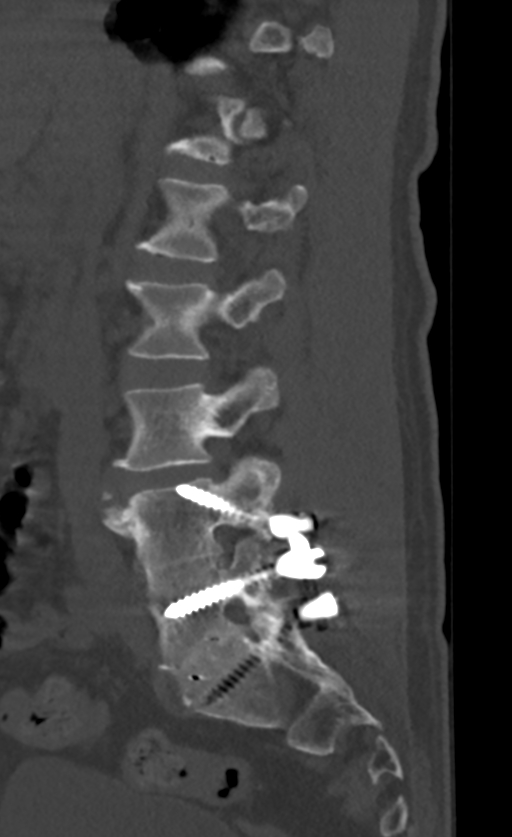
[im 30/71  bone]
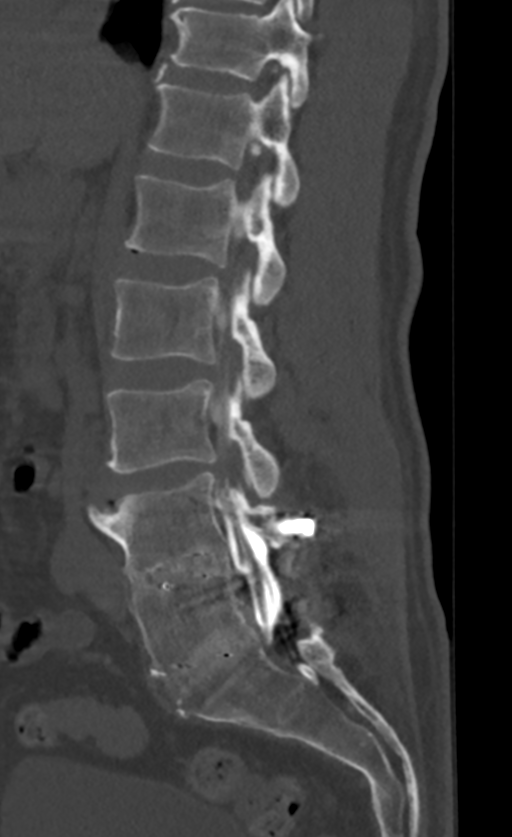
[im 36/71  soft-tissue]
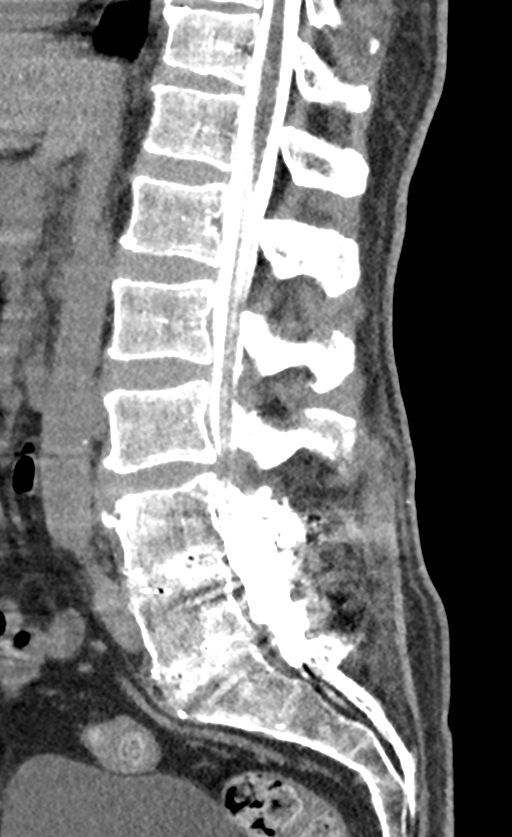
[im 36/71  bone]
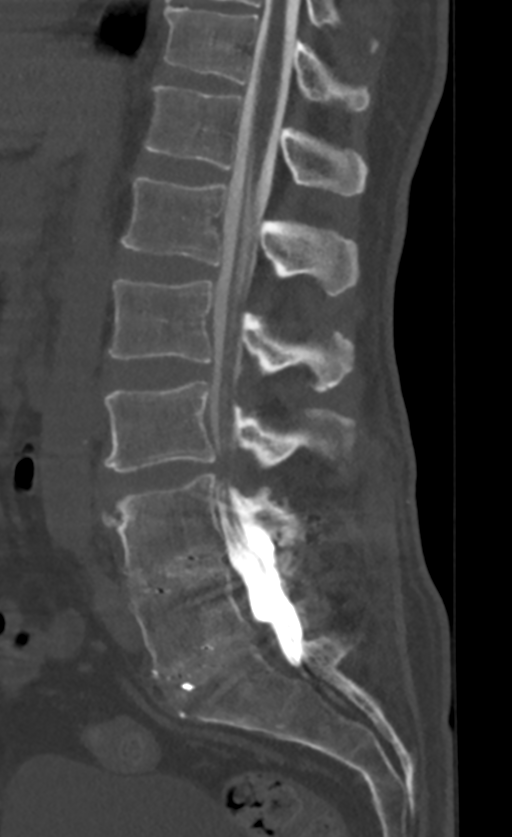
[im 41/71  bone]
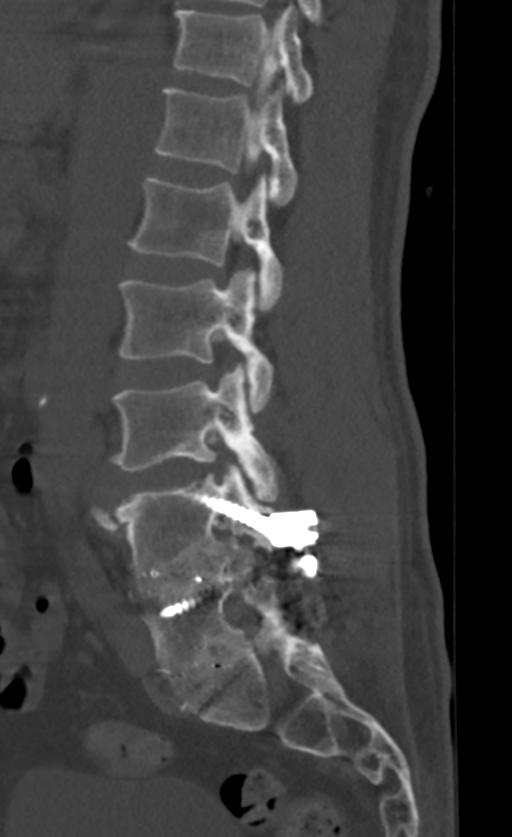
[im 47/71  bone]
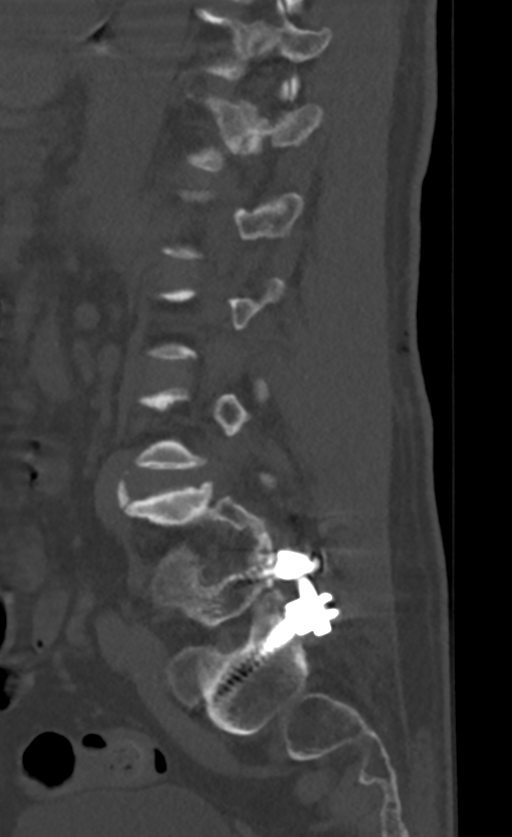

[Series 12: coronal bone lower · coronal · 0.39mm/px · 3 of 78 slices shown]
[im 9/78  bone]
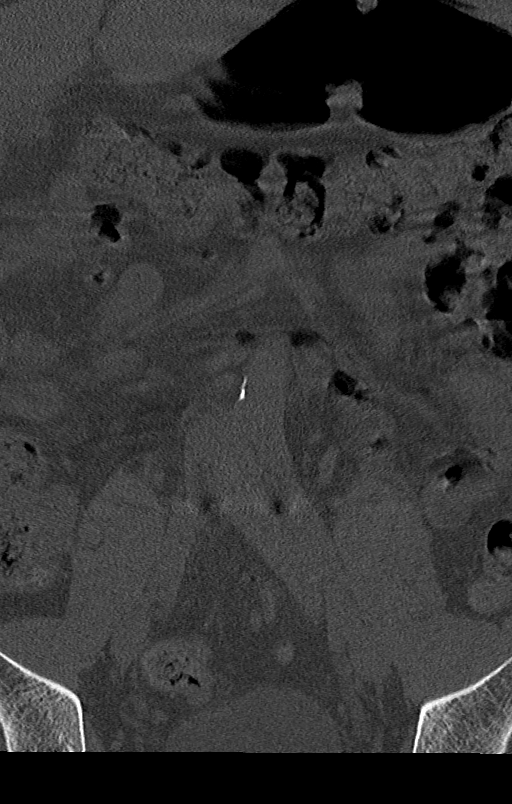
[im 39/78  bone]
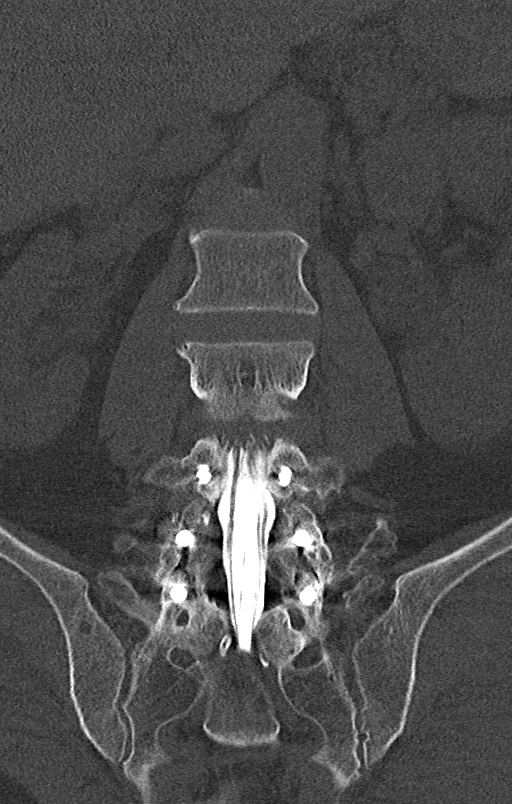
[im 70/78  bone]
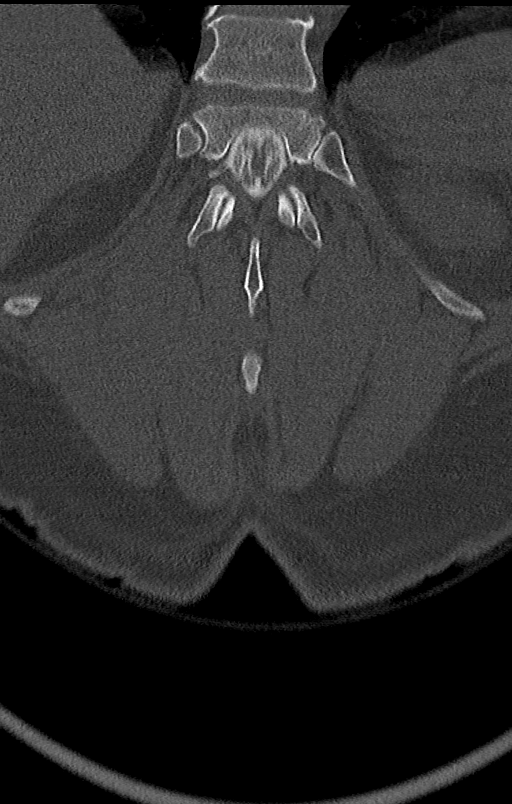

[10 of 33 positions shown; findings below may reference images not displayed]

FLUOROSCOPY TIME:  0 minutes 48 seconds. 1245.15 micro gray meter
squared

PROCEDURE:
CERVICAL, thoracic AND LUMBAR MYELOGRAM

CT CERVICAL MYELOGRAM

CT thoracic myelogram

CT LUMBAR MYELOGRAM

Lumbar puncture and contrast injection was performed by Dr. Kriptavicius.

I personally performed  acquisition of the myelogram images.
FINDINGS: CERVICAL AND LUMBAR MYELOGRAM FINDINGS:

Lumbar region: The patient has had previous discectomy and fusion
from L4 to the sacrum. There is moderate multifactorial stenosis at
the L3-4 level with anterior and posterior encroachment. Flexion
extension views do not show any significant change. There is
potential for neural compression on either side at this level. There
is wide patency in the fusion segment. No significant finding at
L1-2 or L2-3.

Thoracic region: No canal stenosis.  No extradural defect seen.

Cervical region: Previous ACDF C4 through C7. Wide patency of the
canal and foramina. No neural compressive abnormality seen. Minimal
anterior extradural defect at C3-4.

CT CERVICAL MYELOGRAM FINDINGS:

Foramen magnum is widely patent.  C1-2 and C2-3 are normal.

C3-4: Mild disc bulge and right uncovertebral hypertrophy. No
compressive canal stenosis. Mild right foraminal narrowing.

C4 through C7: Previous ACDF. Fusion is solid. No screw malposition
or loosening. Wide patency of the canal and foramina.

C7-T1: Minimal facet hypertrophy and uncovertebral hypertrophy. No
canal or foraminal stenosis.

CT THORACIC MYELOGRAM FINDINGS:

T1-2: Bulging of the disc towards the left. Mild narrowing of the
ventral subarachnoid space on the left but no apparent neural
compression. Foramina appear sufficiently patent.

T2-3: Spondylosis with endplate osteophytes and bulging of the disc.
No compressive canal stenosis. Left foraminal encroachment by
osteophytes that could affect the left T2 nerve.

T3-4: Endplate osteophytes. No canal stenosis. Mild bony foraminal
encroachment on the left, not likely compressive.

T4-5: Normal interspace.

T5-6: Shallow right posterolateral disc herniation narrows the
ventral subarachnoid space but does not affect the cord or show
foraminal compromise.

T6-7: Shallow left-sided disc protrusion narrows the ventral
subarachnoid space but does not affect the cord or show foraminal
extension.

T7-8: Minimal left-sided disc bulge and osteophytes but no neural
compression.

T8-9: Chronic left-sided disc protrusion and osteophytes. No central
canal stenosis. Mild foraminal encroachment on the left.

T9-10: Chronic left-sided disc protrusion with calcification. No
central canal stenosis. Mild left foraminal narrowing.

T10-11: Normal interspace.

T11-12: Normal interspace.

T12-L1: Normal interspace.

CT LUMBAR MYELOGRAM FINDINGS:

No abnormality at L1-2 or L2-3.

L3-4: Endplate osteophytes and broad-based herniation of the disc.
Facet and ligamentous hypertrophy. Spinal stenosis at this level
with effacement of the subarachnoid space and crowding of the nerve
roots. Considerable potential for neural compression at this level.
There may also be some focal disc material in the left foraminal to
extraforaminal region.

L4 to sacrum: Distant posterior decompression, diskectomy and
fusion. Solid union with wide patency of the canal and foramina. No
hardware complication.

Chronic sacroiliac ankylosis.
IMPRESSION: Cervical region: Good appearance in the fusion segment from C4
through C7. C3-4 disc bulge and right uncovertebral hypertrophy with
mild right foraminal stenosis.

Thoracic region: Multilevel chronic disc degeneration in the
thoracic region as outlined above. No sign of acute disc herniation.
Foraminal encroachment would have some potential for neural
compression particularly on the left at T2-3. Other lesser findings
on the left at T3-4, T7-8, T8-9 and T9-10.

Lumbar region: Severe multifactorial stenosis at L3-4 due to disc
herniation and facet and ligamentous hypertrophy. There may also be
some left extraforaminal disc material. Good appearance in the
fusion segment from L4 to the sacrum.
